# Patient Record
Sex: Female | Born: 1960 | Race: White | Hispanic: No | Marital: Married | Smoking: Never smoker
Health system: Southern US, Community
[De-identification: ages and names within clinical notes are randomized; demographics above are authoritative.]

## PROBLEM LIST (undated history)

## (undated) DIAGNOSIS — M1990 Unspecified osteoarthritis, unspecified site: Secondary | ICD-10-CM

## (undated) DIAGNOSIS — F1910 Other psychoactive substance abuse, uncomplicated: Secondary | ICD-10-CM

## (undated) DIAGNOSIS — F329 Major depressive disorder, single episode, unspecified: Secondary | ICD-10-CM

## (undated) DIAGNOSIS — F32A Depression, unspecified: Secondary | ICD-10-CM

## (undated) HISTORY — DX: Unspecified osteoarthritis, unspecified site: M19.90

## (undated) HISTORY — PX: KNEE ARTHROSCOPY WITH ANTERIOR CRUCIATE LIGAMENT (ACL) REPAIR: SHX5644

## (undated) HISTORY — DX: Depression, unspecified: F32.A

## (undated) HISTORY — DX: Major depressive disorder, single episode, unspecified: F32.9

## (undated) HISTORY — DX: Other psychoactive substance abuse, uncomplicated: F19.10

---

## 2017-01-11 DIAGNOSIS — Z79899 Other long term (current) drug therapy: Secondary | ICD-10-CM | POA: Diagnosis not present

## 2017-01-11 DIAGNOSIS — M069 Rheumatoid arthritis, unspecified: Secondary | ICD-10-CM | POA: Diagnosis not present

## 2017-01-16 DIAGNOSIS — M797 Fibromyalgia: Secondary | ICD-10-CM | POA: Diagnosis not present

## 2017-01-16 DIAGNOSIS — M2550 Pain in unspecified joint: Secondary | ICD-10-CM | POA: Diagnosis not present

## 2017-01-16 DIAGNOSIS — M069 Rheumatoid arthritis, unspecified: Secondary | ICD-10-CM | POA: Diagnosis not present

## 2017-01-16 DIAGNOSIS — Z79899 Other long term (current) drug therapy: Secondary | ICD-10-CM | POA: Diagnosis not present

## 2017-03-13 DIAGNOSIS — M797 Fibromyalgia: Secondary | ICD-10-CM | POA: Diagnosis not present

## 2017-03-13 DIAGNOSIS — M069 Rheumatoid arthritis, unspecified: Secondary | ICD-10-CM | POA: Diagnosis not present

## 2017-03-13 DIAGNOSIS — M2550 Pain in unspecified joint: Secondary | ICD-10-CM | POA: Diagnosis not present

## 2017-03-13 DIAGNOSIS — E663 Overweight: Secondary | ICD-10-CM | POA: Diagnosis not present

## 2017-04-23 DIAGNOSIS — F329 Major depressive disorder, single episode, unspecified: Secondary | ICD-10-CM | POA: Diagnosis not present

## 2017-04-23 DIAGNOSIS — L237 Allergic contact dermatitis due to plants, except food: Secondary | ICD-10-CM | POA: Diagnosis not present

## 2017-06-20 DIAGNOSIS — M797 Fibromyalgia: Secondary | ICD-10-CM | POA: Diagnosis not present

## 2017-06-20 DIAGNOSIS — M2550 Pain in unspecified joint: Secondary | ICD-10-CM | POA: Diagnosis not present

## 2017-06-20 DIAGNOSIS — M069 Rheumatoid arthritis, unspecified: Secondary | ICD-10-CM | POA: Diagnosis not present

## 2017-06-20 DIAGNOSIS — Z79899 Other long term (current) drug therapy: Secondary | ICD-10-CM | POA: Diagnosis not present

## 2017-09-05 ENCOUNTER — Encounter: Payer: Self-pay | Admitting: Physician Assistant

## 2017-09-05 ENCOUNTER — Ambulatory Visit (INDEPENDENT_AMBULATORY_CARE_PROVIDER_SITE_OTHER): Payer: BLUE CROSS/BLUE SHIELD | Admitting: Physician Assistant

## 2017-09-05 VITALS — BP 97/67 | HR 74 | Temp 98.6°F | Resp 18 | Ht 68.58 in | Wt 196.9 lb

## 2017-09-05 DIAGNOSIS — R05 Cough: Secondary | ICD-10-CM | POA: Diagnosis not present

## 2017-09-05 DIAGNOSIS — M069 Rheumatoid arthritis, unspecified: Secondary | ICD-10-CM | POA: Insufficient documentation

## 2017-09-05 DIAGNOSIS — R609 Edema, unspecified: Secondary | ICD-10-CM | POA: Diagnosis not present

## 2017-09-05 DIAGNOSIS — J069 Acute upper respiratory infection, unspecified: Secondary | ICD-10-CM | POA: Diagnosis not present

## 2017-09-05 DIAGNOSIS — M797 Fibromyalgia: Secondary | ICD-10-CM | POA: Insufficient documentation

## 2017-09-05 DIAGNOSIS — R52 Pain, unspecified: Secondary | ICD-10-CM | POA: Diagnosis not present

## 2017-09-05 DIAGNOSIS — J3489 Other specified disorders of nose and nasal sinuses: Secondary | ICD-10-CM | POA: Diagnosis not present

## 2017-09-05 DIAGNOSIS — R059 Cough, unspecified: Secondary | ICD-10-CM

## 2017-09-05 LAB — POCT CBC
GRANULOCYTE PERCENT: 56.1 % (ref 37–80)
HCT, POC: 41.1 % (ref 37.7–47.9)
Hemoglobin: 14.1 g/dL (ref 12.2–16.2)
Lymph, poc: 2.6 (ref 0.6–3.4)
MCH: 31.8 pg — AB (ref 27–31.2)
MCHC: 34.3 g/dL (ref 31.8–35.4)
MCV: 92.8 fL (ref 80–97)
MID (CBC): 0.3 (ref 0–0.9)
MPV: 8.2 fL (ref 0–99.8)
PLATELET COUNT, POC: 275 10*3/uL (ref 142–424)
POC Granulocyte: 3.7 (ref 2–6.9)
POC LYMPH %: 39 % (ref 10–50)
POC MID %: 4.9 % (ref 0–12)
RBC: 4.43 M/uL (ref 4.04–5.48)
RDW, POC: 13.8 %
WBC: 6.6 10*3/uL (ref 4.6–10.2)

## 2017-09-05 LAB — POCT INFLUENZA A/B
INFLUENZA A, POC: NEGATIVE
INFLUENZA B, POC: NEGATIVE

## 2017-09-05 MED ORDER — FLUTICASONE PROPIONATE 50 MCG/ACT NA SUSP: 2.0000 | Freq: Every day | NASAL | 0 refills | Status: DC

## 2017-09-05 MED ORDER — BENZONATATE 100 MG PO CAPS: 100.0000 mg | ORAL_CAPSULE | Freq: Three times a day (TID) | ORAL | 0 refills | Status: DC | PRN

## 2017-09-05 MED ORDER — ALBUTEROL SULFATE (2.5 MG/3ML) 0.083% IN NEBU
2.5000 mg | INHALATION_SOLUTION | Freq: Once | RESPIRATORY_TRACT | Status: AC
  Administered 2017-09-05: 2.5 mg via RESPIRATORY_TRACT

## 2017-09-05 MED ORDER — PSEUDOEPHEDRINE HCL ER 120 MG PO TB12: 120.0000 mg | ORAL_TABLET | Freq: Two times a day (BID) | ORAL | 0 refills | Status: DC

## 2017-09-05 MED ORDER — IPRATROPIUM BROMIDE 0.02 % IN SOLN
0.5000 mg | Freq: Once | RESPIRATORY_TRACT | Status: AC
  Administered 2017-09-05: 0.5 mg via RESPIRATORY_TRACT

## 2017-09-05 NOTE — Progress Notes (Signed)
MRN: [id number] DOB: [birth year]  Subjective:   Tracey Branch is a 56 y.o. female presenting for chief complaint of Fatigue (X 4 days- Pt states she is having flu like symptoms ); Cough; and Generalized Body Aches .  Reports 4 days hx sudden onset chills, headache, sinus pain, rhinorrhea, ear fullness, sore throat, myalgia, post nasal drip, and non productive cough  chills and fatigue. Has noticed some wheezing. Has tried dayquil and theraflu with moderate relief. Denies fever, sinus headache, shortness of breath and chest pain, nausea, vomiting and abdominal pain. 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 No history of seasonal allergies, no history of asthma. Patient has not had flu shot this season. Denies smoking and alcohol use. Denies any other aggravating or relieving factors, no other questions or concerns. Typically has lower blood pressure. Has been able to drink fluids and eat food.   Desarie has a current medication list which includes the following prescription(s): duloxetine, fluoxetine, methotrexate, benzonatate, rheumate, fluticasone, pseudoephedrine, and xeljanz xr. Also is allergic to iodine.  Carloyn  has a past medical history of Arthritis; Depression; and Substance abuse. Also  has no past surgical history on file.   Objective:   Vitals: BP 97/67 (BP Location: Right Arm, Patient Position: Sitting, Cuff Size: Large)   Pulse 74   Temp 98.6 F (37 C) (Oral)   Resp 18   Ht 5' 8.58" (1.742 m)   Wt 196 lb 14.4 oz (89.3 kg)   SpO2 97%   BMI 29.43 kg/m   Physical Exam  Constitutional: She is oriented to person, place, and time. She appears well-developed and well-nourished. She appears distressed (appears uncomfortable sitting on exam table).  HENT:  Head: Normocephalic and atraumatic.  Right Ear: Tympanic membrane, external ear and ear canal normal.  Left Ear: Tympanic membrane, external ear and ear canal normal.  Nose: Mucosal  edema (mild noted bilaterally) present. No rhinorrhea. Right sinus exhibits maxillary sinus tenderness (mild with palpation and leanign head forward). Right sinus exhibits no frontal sinus tenderness. Left sinus exhibits maxillary sinus tenderness (mild with palpation and leanign head forward). Left sinus exhibits no frontal sinus tenderness.  Mouth/Throat: Uvula is midline, oropharynx is clear and moist and mucous membranes are normal.  Eyes: Conjunctivae are normal.  Neck: Normal range of motion.  Cardiovascular: Normal rate, regular rhythm and normal heart sounds.   Pulmonary/Chest: Effort normal. She has no decreased breath sounds. She has wheezes (no wheezes with normal breaths, minimal wheezing ausculated at end of forced expiratory breaths). She has no rhonchi. She has no rales.  Lymphadenopathy:       Head (right side): No submental, no submandibular, no tonsillar, no preauricular, no posterior auricular and no occipital adenopathy present.       Head (left side): No submental, no submandibular, no tonsillar, no preauricular, no posterior auricular and no occipital adenopathy present.    She has no cervical adenopathy.       Right: No supraclavicular adenopathy present.       Left: No supraclavicular adenopathy present.  Neurological: She is alert and oriented to person, place, and time.  Skin: Skin is warm and dry.  Psychiatric: She has a normal mood and affect.  Vitals reviewed.   Results for orders placed or performed in visit on 09/05/17 (from the past 24 hour(s))  POCT Influenza A/B     Status: None   Collection Time: 09/05/17 11:24 AM  Result Value Ref Range  Influenza A, POC Negative Negative   Influenza B, POC Negative Negative  POCT CBC     Status: Abnormal   Collection Time: 09/05/17 11:50 AM  Result Value Ref Range   WBC 6.6 4.6 - 10.2 K/uL   Lymph, poc 2.6 0.6 - 3.4   POC LYMPH PERCENT 39.0 10 - 50 %L   MID (cbc) 0.3 0 - 0.9   POC MID % 4.9 0 - 12 %M   POC  Granulocyte 3.7 2 - 6.9   Granulocyte percent 56.1 37 - 80 %G   RBC 4.43 4.04 - 5.48 M/uL   Hemoglobin 14.1 12.2 - 16.2 g/dL   HCT, POC 27.0 35.0 - 47.9 %   MCV 92.8 80 - 97 fL   MCH, POC 31.8 (A) 27 - 31.2 pg   MCHC 34.3 31.8 - 35.4 g/dL   RDW, POC 09.3 %   Platelet Count, POC 275 142 - 424 K/uL   MPV 8.2 0 - 99.8 fL    Assessment and Plan :  1. Body aches - POCT Influenza A/B  2. Cough - albuterol (PROVENTIL) (2.5 MG/3ML) 0.083% nebulizer solution 2.5 mg; Take 3 mLs (2.5 mg total) by nebulization once. - ipratropium (ATROVENT) nebulizer solution 0.5 mg; Take 2.5 mLs (0.5 mg total) by nebulization once. - POCT CBC - benzonatate (TESSALON) 100 MG capsule; Take 1-2 capsules (100-200 mg total) by mouth 3 (three) times daily as needed for cough.  Dispense: 40 capsule; Refill: 0  3. Sinus pressure - pseudoephedrine (SUDAFED 12 HOUR) 120 MG 12 hr tablet; Take 1 tablet (120 mg total) by mouth 2 (two) times daily.  Dispense: 20 tablet; Refill: 0  4. Mucosal edema - fluticasone (FLONASE) 50 MCG/ACT nasal spray; Place 2 sprays into both nostrils daily.  Dispense: 16 g; Refill: 0  5. Acute upper respiratory infection Hx and PE findings consistent with viral URI. POCT reassuring. Vitals stable. Will treat symptomatically at this time. Pt encouraged to return to clinic if symptoms worsen, do not improve in 3-5 days, or as needed  Benjiman Core, PA-C  [facility] at [facility] 09/05/2017 1:58 PM

## 2017-09-05 NOTE — Patient Instructions (Addendum)
- We will treat this as a respiratory viral infection.  - I recommend you rest, drink plenty of fluids, eat light meals including soups.  - You may use Tessalon pearls during the day for your cough. - You may use sudafed and flonase daily for head and nasal congestion  - You may also use Tylenol or ibuprofen over-the-counter for your sore throat.  - Please let me know if you are not seeing any improvement or get worse in 3-5 days. Return sooner if symptoms worsen.    Upper Respiratory Infection, Adult Most upper respiratory infections (URIs) are caused by a virus. A URI affects the nose, throat, and upper air passages. The most common type of URI is often called "the common cold." Follow these instructions at home:  Take medicines only as told by your doctor.  Gargle warm saltwater or take cough drops to comfort your throat as told by your doctor.  Use a warm mist humidifier or inhale steam from a shower to increase air moisture. This may make it easier to breathe.  Drink enough fluid to keep your pee (urine) clear or pale yellow.  Eat soups and other clear broths.  Have a healthy diet.  Rest as needed.  Go back to work when your fever is gone or your doctor says it is okay. ? You may need to stay home longer to avoid giving your URI to others. ? You can also wear a face mask and wash your hands often to prevent spread of the virus.  Use your inhaler more if you have asthma.  Do not use any tobacco products, including cigarettes, chewing tobacco, or electronic cigarettes. If you need help quitting, ask your doctor. Contact a doctor if:  You are getting worse, not better.  Your symptoms are not helped by medicine.  You have chills.  You are getting more short of breath.  You have brown or red mucus.  You have yellow or brown discharge from your nose.  You have pain in your face, especially when you bend forward.  You have a fever.  You have puffy (swollen) neck  glands.  You have pain while swallowing.  You have white areas in the back of your throat. Get help right away if:  You have very bad or constant: ? Headache. ? Ear pain. ? Pain in your forehead, behind your eyes, and over your cheekbones (sinus pain). ? Chest pain.  You have long-lasting (chronic) lung disease and any of the following: ? Wheezing. ? Long-lasting cough. ? Coughing up blood. ? A change in your usual mucus.  You have a stiff neck.  You have changes in your: ? Vision. ? Hearing. ? Thinking. ? Mood. This information is not intended to replace advice given to you by your health care provider. Make sure you discuss any questions you have with your health care provider. Document Released: 05/15/2008 Document Revised: 07/30/2016 Document Reviewed: 03/04/2014 Elsevier Interactive Patient Education  2018 [employer].      IF you received an x-ray today, you will receive an invoice from [facility]. Please contact [facility] at [phone] with questions or concerns regarding your invoice.   IF you received labwork today, you will receive an invoice from [facility]. Please contact [facility] at [phone] with questions or concerns regarding your invoice.   Our billing staff will not be able to assist you with questions regarding bills from these companies.  You will be contacted with the lab results as soon as  they are available. The fastest way to get your results is to activate your My Chart account. Instructions are located on the last page of this paperwork. If you have not heard from [place] regarding the results in 2 weeks, please contact this office.

## 2017-09-05 NOTE — Progress Notes (Deleted)
MRN: [id number] DOB: [birth year]  Subjective:   Tracey Branch is a 56 y.o. female presenting for chief complaint of Fatigue (X 4 days- Pt states she is having flu like symptoms ); Cough; and Generalized Body Aches .  Reports 4 days hx sudden onset chills, headache, sinus pain, rhinorrhea, ear fullness, sore throat, myalgia, post nasal drip, and non productive cough  chills and fatigue. Has noticed some wheezing. Has tried dayquil and theraflu with moderate relief. Denies fever, sinus headache, shortness of breath and chest pain, nausea, vomiting and abdominal pain. 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 No history of seasonal allergies, no history of asthma. Patient has not had flu shot this season. Denies smoking and alcohol use. Denies any other aggravating or relieving factors, no other questions or concerns. Typically has lower blood pressure. Has been able to drink fluids and eat food.    Tracey Branch has a current medication list which includes the following prescription(s): duloxetine, fluoxetine, methotrexate, rheumate, and xeljanz xr, and the following Facility-Administered Medications: albuterol and ipratropium. Also is allergic to iodine.  Tracey Branch  has a past medical history of Arthritis; Depression; and Substance abuse. Also  has no past surgical history on file.   Objective:   Vitals: BP 97/67 (BP Location: Right Arm, Patient Position: Sitting, Cuff Size: Large)   Pulse 74   Temp 98.6 F (37 C) (Oral)   Resp 18   Ht 5' 8.58" (1.742 m)   Wt 196 lb 14.4 oz (89.3 kg)   SpO2 97%   BMI 29.43 kg/m   Physical Exam  Constitutional: She is oriented to person, place, and time. She appears well-developed and well-nourished. She appears distressed (appears uncomfortable sitting on exam table).  HENT:  Head: Normocephalic and atraumatic.  Right Ear: Tympanic membrane, external ear and ear canal normal.  Left Ear: Tympanic membrane, external ear and  ear canal normal.  Nose: Mucosal edema (mild noted bilaterally) present. Right sinus exhibits maxillary sinus tenderness (with palpation and leaning head forward). Right sinus exhibits no frontal sinus tenderness. Left sinus exhibits maxillary sinus tenderness (with palpation and leaning head forward). Left sinus exhibits no frontal sinus tenderness.  Mouth/Throat: Uvula is midline, oropharynx is clear and moist and mucous membranes are normal. No tonsillar exudate.  Eyes: Conjunctivae are normal.  Neck: Normal range of motion.  Cardiovascular: Normal rate, regular rhythm and normal heart sounds.   Pulmonary/Chest: Effort normal. She has no decreased breath sounds. She has wheezes (no wheezes with normal breaths, mild wheezing noted with forced expiratory breaths). She has no rhonchi. She has no rales.  Lymphadenopathy:       Head (right side): No submental, no submandibular, no tonsillar, no preauricular, no posterior auricular and no occipital adenopathy present.       Head (left side): No submental, no submandibular, no tonsillar, no preauricular, no posterior auricular and no occipital adenopathy present.    She has no cervical adenopathy.       Right: No supraclavicular adenopathy present.       Left: No supraclavicular adenopathy present.  Neurological: She is alert and oriented to person, place, and time.  Skin: Skin is warm and dry.  Psychiatric: She has a normal mood and affect.  Vitals reviewed.   Results for orders placed or performed in visit on 09/05/17 (from the past 24 hour(s))  POCT Influenza A/B     Status: None   Collection Time: 09/05/17 11:24 AM  Result  Value Ref Range   Influenza A, POC Negative Negative   Influenza B, POC Negative Negative  POCT CBC     Status: Abnormal   Collection Time: 09/05/17 11:50 AM  Result Value Ref Range   WBC 6.6 4.6 - 10.2 K/uL   Lymph, poc 2.6 0.6 - 3.4   POC LYMPH PERCENT 39.0 10 - 50 %L   MID (cbc) 0.3 0 - 0.9   POC MID % 4.9 0 - 12  %M   POC Granulocyte 3.7 2 - 6.9   Granulocyte percent 56.1 37 - 80 %G   RBC 4.43 4.04 - 5.48 M/uL   Hemoglobin 14.1 12.2 - 16.2 g/dL   HCT, POC 00.9 38.1 - 47.9 %   MCV 92.8 80 - 97 fL   MCH, POC 31.8 (A) 27 - 31.2 pg   MCHC 34.3 31.8 - 35.4 g/dL   RDW, POC 82.9 %   Platelet Count, POC 275 142 - 424 K/uL   MPV 8.2 0 - 99.8 fL    Assessment and Plan :  1. Body aches *** - POCT Influenza A/B   Benjiman Core, PA-C  [facility] 09/05/2017 12:02 PM

## 2017-10-01 ENCOUNTER — Other Ambulatory Visit: Payer: Self-pay | Admitting: Physician Assistant

## 2017-10-01 DIAGNOSIS — R609 Edema, unspecified: Secondary | ICD-10-CM

## 2017-10-08 DIAGNOSIS — Z23 Encounter for immunization: Secondary | ICD-10-CM | POA: Diagnosis not present

## 2017-10-08 DIAGNOSIS — M797 Fibromyalgia: Secondary | ICD-10-CM | POA: Diagnosis not present

## 2017-10-08 DIAGNOSIS — M2550 Pain in unspecified joint: Secondary | ICD-10-CM | POA: Diagnosis not present

## 2017-10-08 DIAGNOSIS — M069 Rheumatoid arthritis, unspecified: Secondary | ICD-10-CM | POA: Diagnosis not present

## 2017-10-22 ENCOUNTER — Other Ambulatory Visit: Payer: BLUE CROSS/BLUE SHIELD | Admitting: Internal Medicine

## 2017-10-22 DIAGNOSIS — Z130 Encounter for screening for diseases of the blood and blood-forming organs and certain disorders involving the immune mechanism: Secondary | ICD-10-CM

## 2017-10-22 DIAGNOSIS — Z13220 Encounter for screening for lipoid disorders: Secondary | ICD-10-CM | POA: Diagnosis not present

## 2017-10-22 DIAGNOSIS — Z1329 Encounter for screening for other suspected endocrine disorder: Secondary | ICD-10-CM | POA: Diagnosis not present

## 2017-10-22 DIAGNOSIS — Z1321 Encounter for screening for nutritional disorder: Secondary | ICD-10-CM

## 2017-10-22 DIAGNOSIS — Z0000 Encounter for general adult medical examination without abnormal findings: Secondary | ICD-10-CM | POA: Diagnosis not present

## 2017-10-23 ENCOUNTER — Other Ambulatory Visit (HOSPITAL_COMMUNITY)
Admission: RE | Admit: 2017-10-23 | Discharge: 2017-10-23 | Disposition: A | Discharge status: Home / Self Care | Payer: BLUE CROSS/BLUE SHIELD | Source: Ambulatory Visit | Attending: Internal Medicine | Admitting: Internal Medicine

## 2017-10-23 ENCOUNTER — Encounter: Payer: Self-pay | Admitting: Internal Medicine

## 2017-10-23 ENCOUNTER — Ambulatory Visit (INDEPENDENT_AMBULATORY_CARE_PROVIDER_SITE_OTHER): Payer: BLUE CROSS/BLUE SHIELD | Admitting: Internal Medicine

## 2017-10-23 VITALS — BP 98/62 | HR 68 | Temp 97.8°F | Ht 68.0 in | Wt 199.0 lb

## 2017-10-23 DIAGNOSIS — K219 Gastro-esophageal reflux disease without esophagitis: Secondary | ICD-10-CM

## 2017-10-23 DIAGNOSIS — M069 Rheumatoid arthritis, unspecified: Secondary | ICD-10-CM | POA: Diagnosis not present

## 2017-10-23 DIAGNOSIS — E7800 Pure hypercholesterolemia, unspecified: Secondary | ICD-10-CM | POA: Diagnosis not present

## 2017-10-23 DIAGNOSIS — Z0000 Encounter for general adult medical examination without abnormal findings: Secondary | ICD-10-CM | POA: Insufficient documentation

## 2017-10-23 LAB — COMPLETE METABOLIC PANEL WITH GFR
AG Ratio: 1.8 (calc) (ref 1.0–2.5)
ALKALINE PHOSPHATASE (APISO): 48 U/L (ref 33–130)
ALT: 24 U/L (ref 6–29)
AST: 25 U/L (ref 10–35)
Albumin: 4.2 g/dL (ref 3.6–5.1)
BILIRUBIN TOTAL: 0.5 mg/dL (ref 0.2–1.2)
BUN: 18 mg/dL (ref 7–25)
CHLORIDE: 106 mmol/L (ref 98–110)
CO2: 30 mmol/L (ref 20–32)
CREATININE: 0.88 mg/dL (ref 0.50–1.05)
Calcium: 9.5 mg/dL (ref 8.6–10.4)
GFR, Est African American: 86 mL/min/{1.73_m2} (ref >=60)
GFR, Est Non African American: 74 mL/min/{1.73_m2} (ref >=60)
GLUCOSE: 87 mg/dL (ref 65–99)
Globulin: 2.4 g/dL (calc) (ref 1.9–3.7)
Potassium: 4.9 mmol/L (ref 3.5–5.3)
Sodium: 141 mmol/L (ref 135–146)
Total Protein: 6.6 g/dL (ref 6.1–8.1)

## 2017-10-23 LAB — CBC WITH DIFFERENTIAL/PLATELET
BASOS ABS: 10 {cells}/uL (ref 0–200)
BASOS PCT: 0.2 %
EOS ABS: 78 {cells}/uL (ref 15–500)
EOS PCT: 1.5 %
HCT: 39.4 % (ref 35.0–45.0)
HEMOGLOBIN: 13.6 g/dL (ref 11.7–15.5)
Lymphs Abs: 1654 cells/uL (ref 850–3900)
MCH: 31.5 pg (ref 27.0–33.0)
MCHC: 34.5 g/dL (ref 32.0–36.0)
MCV: 91.2 fL (ref 80.0–100.0)
MONOS PCT: 9.8 %
MPV: 11.1 fL (ref 7.5–12.5)
Neutro Abs: 2948 cells/uL (ref 1500–7800)
Neutrophils Relative %: 56.7 %
PLATELETS: 257 10*3/uL (ref 140–400)
RBC: 4.32 10*6/uL (ref 3.80–5.10)
RDW: 12.5 % (ref 11.0–15.0)
TOTAL LYMPHOCYTE: 31.8 %
WBC mixed population: 510 cells/uL (ref 200–950)
WBC: 5.2 10*3/uL (ref 3.8–10.8)

## 2017-10-23 LAB — POCT URINALYSIS DIPSTICK
BILIRUBIN UA: NEGATIVE
Blood, UA: NEGATIVE
GLUCOSE UA: NEGATIVE
KETONES UA: NEGATIVE
LEUKOCYTES UA: NEGATIVE
Nitrite, UA: NEGATIVE
PH UA: 6.5 (ref 5.0–8.0)
Protein, UA: NEGATIVE
Spec Grav, UA: 1.025 (ref 1.010–1.025)
Urobilinogen, UA: 0.2 E.U./dL

## 2017-10-23 LAB — LIPID PANEL
CHOL/HDL RATIO: 4.6 (calc) (ref <5.0)
CHOLESTEROL: 291 mg/dL — AB (ref <200)
HDL: 63 mg/dL (ref >50)
LDL CHOLESTEROL (CALC): 210 mg/dL — AB
Non-HDL Cholesterol (Calc): 228 mg/dL (calc) — ABNORMAL HIGH (ref <130)
TRIGLYCERIDES: 70 mg/dL (ref <150)

## 2017-10-23 LAB — TSH: TSH: 4.04 m[IU]/L

## 2017-10-23 LAB — VITAMIN D 25 HYDROXY (VIT D DEFICIENCY, FRACTURES): VIT D 25 HYDROXY: 28 ng/mL — AB (ref 30–100)

## 2017-10-23 MED ORDER — OMEPRAZOLE 20 MG PO CPDR: 20.0000 mg | DELAYED_RELEASE_CAPSULE | Freq: Every day | ORAL | 3 refills | Status: DC

## 2017-10-23 MED ORDER — FLUOXETINE HCL 40 MG PO CAPS: 40.0000 mg | ORAL_CAPSULE | Freq: Every day | ORAL | 3 refills | Status: DC

## 2017-10-23 MED ORDER — DULOXETINE HCL 20 MG PO CPEP: 20.0000 mg | ORAL_CAPSULE | Freq: Every day | ORAL | 2 refills | Status: DC

## 2017-10-23 MED ORDER — DULOXETINE HCL 20 MG PO CPEP: 20.0000 mg | ORAL_CAPSULE | Freq: Every day | ORAL | 0 refills | Status: DC

## 2017-10-25 ENCOUNTER — Telehealth: Payer: Self-pay

## 2017-10-25 LAB — CYTOLOGY - PAP: DIAGNOSIS: NEGATIVE

## 2017-10-25 NOTE — Telephone Encounter (Signed)
-----   Message from Margaree Mackintosh, MD sent at 10/25/2017 11:09 AM EST ----- Please call pt. Pap is normal.

## 2017-10-25 NOTE — Telephone Encounter (Signed)
Pt is aware.  

## 2017-11-01 ENCOUNTER — Other Ambulatory Visit: Payer: Self-pay | Admitting: Physician Assistant

## 2017-11-01 DIAGNOSIS — R609 Edema, unspecified: Secondary | ICD-10-CM

## 2017-11-09 DIAGNOSIS — E7800 Pure hypercholesterolemia, unspecified: Secondary | ICD-10-CM | POA: Insufficient documentation

## 2017-11-09 NOTE — Progress Notes (Signed)
   Subjective:    Patient ID: Tracey Branch, female    DOB: 1961-11-21, 56 y.o.   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  History of rheumatoid arthritis followed by Elpidio Anis  [facility].  Diagnosed in 2015 when living in [place] complaining of fatigue and bilateral hand and foot pain.  Triggered following mother's death.  Rheumatoid arthritis is seronegative.  History of fibromyalgia syndrome as well.  History of mild depression treated with SSRI.  Rheumatoid arthritis has been treated with Harriette Ohara, methotrexate, Cymbalta and Celebrex  Family history: Father deceased at 21 years of age with  aneurysm.  Mother died at 43 years of age with MI and with history of hypertension.  Paternal grandmother with history of rheumatoid arthritis.  2 brothers in good health.  2 adopted daughter  from [place] both age 90 that are healthy.  2 sons ages 64 and 57 healthy.  Social history: Husband is the [employer] at [employer] in [place].  Moved here from [place] and formerly lived in [place].  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  History of right ACL repair 2006.  History of right meniscal repair 2006.  Became menopausal 2012  Had flu vaccine 10/15/2017   Review of Systems long-standing history of depression most of her adult life.  Patient has been sober since 1998.  Does not smoke.     Objective:   Physical Exam  Constitutional: She is oriented to person, place, and time. She appears well-developed and well-nourished. No distress.  HENT:  Head: Normocephalic and atraumatic.  Right Ear: External ear normal.  Left Ear: External ear normal.  Mouth/Throat: Oropharynx is clear and moist.  Eyes: Conjunctivae and EOM are normal. Pupils are equal, round, and reactive to light. Right eye exhibits no discharge. Left eye exhibits no discharge. No scleral icterus.  Neck: No JVD present.  No thyromegaly present.  Cardiovascular: Normal rate, regular rhythm, normal heart sounds and intact distal pulses.  No murmur heard. Pulmonary/Chest: Effort normal and breath sounds normal. No respiratory distress. She has no wheezes. She has no rales.  Abdominal: Soft. Bowel sounds are normal. She exhibits no distension and no mass. There is no tenderness. There is no rebound and no guarding.  Genitourinary:  Genitourinary Comments: Pap taken.  Bimanual normal  Musculoskeletal: She exhibits no edema.  Lymphadenopathy:    She has no cervical adenopathy.  Neurological: She is alert and oriented to person, place, and time. She has normal reflexes. No cranial nerve deficit. Coordination normal.  Skin: Skin is warm and dry. No rash noted. She is not diaphoretic.  Psychiatric: She has a normal mood and affect. Her behavior is normal. Judgment and thought content normal.  Vitals reviewed.         Assessment & Plan:  Rheumatoid arthritis followed by rheumatologist  History of depression  History of fibromyalgia syndrome  Rash on extremities?  Related to rheumatoid arthritis versus eczema  Hyperlipidemia-total cholesterol 291 and LDL cholesterol 210  Vitamin D deficiency-recommend vitamin D3 daily 2000 units  High normal TSH-repeat in February  Plan: Dermatology consultation.  Talked about starting statin medication but will follow up in 3 months with lipid panel.  Watch diet.  Try to get some exercise.

## 2017-11-09 NOTE — Patient Instructions (Signed)
Follow-up in February with TSH and repeat lipid panel.  See dermatologist regarding rash

## 2017-12-28 ENCOUNTER — Other Ambulatory Visit: Payer: Self-pay | Admitting: Internal Medicine

## 2017-12-28 DIAGNOSIS — R7989 Other specified abnormal findings of blood chemistry: Secondary | ICD-10-CM

## 2017-12-28 DIAGNOSIS — E785 Hyperlipidemia, unspecified: Secondary | ICD-10-CM

## 2018-01-07 DIAGNOSIS — D1801 Hemangioma of skin and subcutaneous tissue: Secondary | ICD-10-CM | POA: Diagnosis not present

## 2018-01-07 DIAGNOSIS — L814 Other melanin hyperpigmentation: Secondary | ICD-10-CM | POA: Diagnosis not present

## 2018-01-07 DIAGNOSIS — L821 Other seborrheic keratosis: Secondary | ICD-10-CM | POA: Diagnosis not present

## 2018-01-07 DIAGNOSIS — D225 Melanocytic nevi of trunk: Secondary | ICD-10-CM | POA: Diagnosis not present

## 2018-01-08 DIAGNOSIS — M069 Rheumatoid arthritis, unspecified: Secondary | ICD-10-CM | POA: Diagnosis not present

## 2018-01-08 DIAGNOSIS — M2550 Pain in unspecified joint: Secondary | ICD-10-CM | POA: Diagnosis not present

## 2018-01-08 DIAGNOSIS — Z79899 Other long term (current) drug therapy: Secondary | ICD-10-CM | POA: Diagnosis not present

## 2018-01-08 DIAGNOSIS — M797 Fibromyalgia: Secondary | ICD-10-CM | POA: Diagnosis not present

## 2018-01-29 ENCOUNTER — Other Ambulatory Visit: Payer: BLUE CROSS/BLUE SHIELD | Admitting: Internal Medicine

## 2018-01-29 DIAGNOSIS — E785 Hyperlipidemia, unspecified: Secondary | ICD-10-CM | POA: Diagnosis not present

## 2018-01-29 DIAGNOSIS — R7989 Other specified abnormal findings of blood chemistry: Secondary | ICD-10-CM | POA: Diagnosis not present

## 2018-01-29 LAB — LIPID PANEL
Cholesterol: 272 mg/dL — ABNORMAL HIGH (ref <200)
HDL: 58 mg/dL (ref >50)
LDL Cholesterol (Calc): 188 mg/dL (calc) — ABNORMAL HIGH
NON-HDL CHOLESTEROL (CALC): 214 mg/dL — AB (ref <130)
TRIGLYCERIDES: 127 mg/dL (ref <150)
Total CHOL/HDL Ratio: 4.7 (calc) (ref <5.0)

## 2018-01-29 LAB — TSH: TSH: 6.86 mIU/L — ABNORMAL HIGH

## 2018-02-05 ENCOUNTER — Ambulatory Visit: Payer: BLUE CROSS/BLUE SHIELD | Admitting: Internal Medicine

## 2018-02-06 ENCOUNTER — Telehealth: Payer: Self-pay

## 2018-02-06 NOTE — Telephone Encounter (Signed)
Left detailed message, pt to call to rescheduled missed apt from yesterday and also we need to know if she's taking her thyroid medication.

## 2018-02-08 ENCOUNTER — Encounter: Payer: Self-pay | Admitting: Internal Medicine

## 2018-02-08 ENCOUNTER — Ambulatory Visit (INDEPENDENT_AMBULATORY_CARE_PROVIDER_SITE_OTHER): Payer: BLUE CROSS/BLUE SHIELD | Admitting: Internal Medicine

## 2018-02-08 VITALS — BP 102/70 | HR 82 | Ht 68.0 in | Wt 201.0 lb

## 2018-02-08 DIAGNOSIS — E785 Hyperlipidemia, unspecified: Secondary | ICD-10-CM

## 2018-02-08 DIAGNOSIS — E039 Hypothyroidism, unspecified: Secondary | ICD-10-CM | POA: Diagnosis not present

## 2018-02-08 MED ORDER — LEVOTHYROXINE SODIUM 50 MCG PO TABS
50.0000 ug | ORAL_TABLET | Freq: Every day | ORAL | 0 refills | Status: DC
Start: 2018-02-08 — End: 2018-02-25

## 2018-02-25 ENCOUNTER — Telehealth: Payer: Self-pay | Admitting: Internal Medicine

## 2018-02-25 MED ORDER — FLUOXETINE HCL 40 MG PO CAPS: 40.0000 mg | ORAL_CAPSULE | Freq: Every day | ORAL | 3 refills | Status: DC

## 2018-02-25 MED ORDER — LEVOTHYROXINE SODIUM 50 MCG PO TABS: 50.0000 ug | ORAL_TABLET | Freq: Every day | ORAL | 3 refills | Status: DC

## 2018-02-25 NOTE — Telephone Encounter (Signed)
Please refill these for one year to [facility]

## 2018-02-25 NOTE — Telephone Encounter (Signed)
Requesting a refill on Levothyroxine and Prozac 40mg  be sent to her [facility] Mail order pharmacy.  She cannot continue to be filled at local pharmacy or her insurance will not pay for Rx's.    Pharmacy:  [facility] [phone] in [place], [place])  Phone:  [phone]  Thank you.

## 2018-03-02 DIAGNOSIS — E039 Hypothyroidism, unspecified: Secondary | ICD-10-CM | POA: Insufficient documentation

## 2018-03-02 NOTE — Progress Notes (Signed)
   Subjective:    Patient ID: Tracey Branch, female    DOB: 07-Apr-1961, 57 y.o.   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  TSH was 4.04 in November and is now 6.86.  This is consistent with hypothyroidism.  Suggest she start thyroid replacement medication and follow-up in 6 weeks.  In November she was found to have significant hyperlipidemia with total cholesterol of 291 and an LDL cholesterol of 210.  HDL cholesterol is good at 63 and triglycerides normal at 70.  Nail total cholesterol was 272 with an LDL cholesterol of 188 with normal HDL and triglycerides.  She really does not want to be on lipid medication.  I think we should treat the hypothyroidism first.  We will start her on 0.05 mg daily levothyroxine and follow-up in 6 weeks with lipid panel and TSH.  She still may need lipid-lowering medication.  She has a history of rheumatoid arthritis and is on Celebrex and Xeljanz.  Also takes Cymbalta and Prozac.    Review of Systems     Objective:   Physical Exam She has no thyromegaly       Assessment & Plan:  Hypothyroidism-new onset  Hyperlipidemia-slightly improved.   Plan: Follow-up in 6 weeks with lipid panel and TSH.  She still may need statin medication to control lipids.   Plan:'s levothyroxine 0.05 mg daily

## 2018-03-02 NOTE — Patient Instructions (Signed)
Begin levothyroxine 0.05 mg daily and follow-up in 6 weeks.  Watch diet and try to get some exercise for elevated lipids.

## 2018-03-13 ENCOUNTER — Other Ambulatory Visit: Payer: Self-pay

## 2018-03-13 DIAGNOSIS — E039 Hypothyroidism, unspecified: Secondary | ICD-10-CM

## 2018-03-13 DIAGNOSIS — E7800 Pure hypercholesterolemia, unspecified: Secondary | ICD-10-CM

## 2018-04-02 ENCOUNTER — Other Ambulatory Visit: Payer: BLUE CROSS/BLUE SHIELD | Admitting: Internal Medicine

## 2018-04-04 ENCOUNTER — Other Ambulatory Visit: Payer: BLUE CROSS/BLUE SHIELD | Admitting: Internal Medicine

## 2018-04-04 DIAGNOSIS — E039 Hypothyroidism, unspecified: Secondary | ICD-10-CM | POA: Diagnosis not present

## 2018-04-04 DIAGNOSIS — E7800 Pure hypercholesterolemia, unspecified: Secondary | ICD-10-CM

## 2018-04-04 LAB — TSH: TSH: 3.09 mIU/L

## 2018-04-04 LAB — LIPID PANEL
CHOLESTEROL: 264 mg/dL — AB (ref <200)
HDL: 55 mg/dL (ref >50)
LDL CHOLESTEROL (CALC): 179 mg/dL — AB
Non-HDL Cholesterol (Calc): 209 mg/dL (calc) — ABNORMAL HIGH (ref <130)
TRIGLYCERIDES: 153 mg/dL — AB (ref <150)
Total CHOL/HDL Ratio: 4.8 (calc) (ref <5.0)

## 2018-04-05 ENCOUNTER — Ambulatory Visit: Payer: BLUE CROSS/BLUE SHIELD | Admitting: Internal Medicine

## 2018-04-05 ENCOUNTER — Encounter: Payer: Self-pay | Admitting: Internal Medicine

## 2018-04-05 VITALS — BP 120/70 | HR 64 | Ht 68.0 in

## 2018-04-05 DIAGNOSIS — M5481 Occipital neuralgia: Secondary | ICD-10-CM | POA: Diagnosis not present

## 2018-04-05 DIAGNOSIS — E782 Mixed hyperlipidemia: Secondary | ICD-10-CM

## 2018-04-05 DIAGNOSIS — E039 Hypothyroidism, unspecified: Secondary | ICD-10-CM

## 2018-04-05 DIAGNOSIS — M0600 Rheumatoid arthritis without rheumatoid factor, unspecified site: Secondary | ICD-10-CM

## 2018-04-05 MED ORDER — ROSUVASTATIN CALCIUM 5 MG PO TABS: ORAL_TABLET | ORAL | 3 refills | Status: DC

## 2018-04-05 MED ORDER — LEVOTHYROXINE SODIUM 75 MCG PO TABS: 75.0000 ug | ORAL_TABLET | Freq: Every day | ORAL | 1 refills | Status: DC

## 2018-04-06 NOTE — Patient Instructions (Signed)
She will see neurologist regarding occipital neuralgia symptoms.  She will increase levothyroxine to the 0.075 mg daily and follow-up in June.  She will take Crestor 5 mg 3 times weekly and follow-up in June.

## 2018-04-06 NOTE — Progress Notes (Signed)
   Subjective:    Patient ID: Tracey Branch, female    DOB: [birth year], 57 y.o.   MRN: [id number]  HPI   Rev. Louks is here to follow up on hypothyroidism and hyperlipidemia.  TSH has improved from 6 0.86-3.09 on levothyroxine 0.05 mg daily.  We are going to increase to 0.075 mg daily and follow-up in a couple of months.  With regard to hyperlipidemia total cholesterol has not improved significantly.  It previously was 272 and is now 264.  Triglycerides of actually gone up from 1 27 to1 53 and LDL cholesterol has not changed a lot from 1 88 to1 79.  She is willing to try statin medication Crestor 5 mg 3 times a week.  She will return in June for fasting lab work and office visit.  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  She has seronegative rheumatoid arthritis and history of fibromyalgia syndrome.  Arthritis was diagnosed in 2015 when living in [place].  She is followed by [facility] rheumatology.  A few times a year she gets about 3 severe sharp pains in a row in her right occipital area.  It will just last for maybe 5 seconds but each sharp pain increases in intensity and then just disappears completely.  She is concerned because her uncle apparently had a cerebral aneurysm.  Father had history of aortic aneurysm.   Review of Systems     Objective:   Physical Exam No thyromegaly  She has point tenderness in her right occipital area and upper neck.  No focal deficits on brief neurological exam       Assessment & Plan:  ?  Occipital neuralgia-refer to neurologist.  Consider C-spine films with history of rheumatoid arthritis  Rheumatoid arthritis  Hypothyroidism-would like to have TSH around 1.00.  She will increase levothyroxine to 0.075 mg daily and follow-up in June.  Hyperlipidemia-she will start Crestor 5 mg 3 times a week and follow-up in June  Plan: She is concerned because there is an apparent history of cerebral aneurysm and uncle.  She will be  referred to neurologist regarding these attacks of pain.  I think what she has today is musculoskeletal pain and have suggested she try over-the-counter anti-inflammatory medication and ice to the right occipital area.  May need C-spine films with history of rheumatoid arthritis.  She is going to see neurologist for an opinion regarding screening for aneurysm.

## 2018-04-10 DIAGNOSIS — M069 Rheumatoid arthritis, unspecified: Secondary | ICD-10-CM | POA: Diagnosis not present

## 2018-04-10 DIAGNOSIS — M2550 Pain in unspecified joint: Secondary | ICD-10-CM | POA: Diagnosis not present

## 2018-04-10 DIAGNOSIS — M797 Fibromyalgia: Secondary | ICD-10-CM | POA: Diagnosis not present

## 2018-04-10 DIAGNOSIS — Z79899 Other long term (current) drug therapy: Secondary | ICD-10-CM | POA: Diagnosis not present

## 2018-05-10 ENCOUNTER — Other Ambulatory Visit: Payer: Self-pay

## 2018-05-10 DIAGNOSIS — Z79899 Other long term (current) drug therapy: Secondary | ICD-10-CM

## 2018-05-10 DIAGNOSIS — Z5181 Encounter for therapeutic drug level monitoring: Secondary | ICD-10-CM

## 2018-05-10 DIAGNOSIS — E039 Hypothyroidism, unspecified: Secondary | ICD-10-CM

## 2018-05-10 DIAGNOSIS — E7800 Pure hypercholesterolemia, unspecified: Secondary | ICD-10-CM

## 2018-05-28 ENCOUNTER — Other Ambulatory Visit: Payer: BLUE CROSS/BLUE SHIELD | Admitting: Internal Medicine

## 2018-05-29 ENCOUNTER — Ambulatory Visit: Payer: BLUE CROSS/BLUE SHIELD | Admitting: Internal Medicine

## 2018-05-31 ENCOUNTER — Ambulatory Visit: Payer: BLUE CROSS/BLUE SHIELD | Admitting: Internal Medicine

## 2018-06-06 ENCOUNTER — Other Ambulatory Visit: Payer: BLUE CROSS/BLUE SHIELD | Admitting: Internal Medicine

## 2018-06-06 DIAGNOSIS — E039 Hypothyroidism, unspecified: Secondary | ICD-10-CM

## 2018-06-06 DIAGNOSIS — Z79899 Other long term (current) drug therapy: Secondary | ICD-10-CM | POA: Diagnosis not present

## 2018-06-06 DIAGNOSIS — Z5181 Encounter for therapeutic drug level monitoring: Secondary | ICD-10-CM

## 2018-06-06 DIAGNOSIS — E7800 Pure hypercholesterolemia, unspecified: Secondary | ICD-10-CM

## 2018-06-06 LAB — LIPID PANEL
CHOL/HDL RATIO: 3.9 (calc) (ref <5.0)
Cholesterol: 232 mg/dL — ABNORMAL HIGH (ref <200)
HDL: 59 mg/dL (ref >50)
LDL Cholesterol (Calc): 152 mg/dL (calc) — ABNORMAL HIGH
NON-HDL CHOLESTEROL (CALC): 173 mg/dL — AB (ref <130)
TRIGLYCERIDES: 100 mg/dL (ref <150)

## 2018-06-06 LAB — HEPATIC FUNCTION PANEL
AG RATIO: 1.8 (calc) (ref 1.0–2.5)
ALT: 26 U/L (ref 6–29)
AST: 24 U/L (ref 10–35)
Albumin: 4.3 g/dL (ref 3.6–5.1)
Alkaline phosphatase (APISO): 57 U/L (ref 33–130)
BILIRUBIN INDIRECT: 0.3 mg/dL (ref 0.2–1.2)
BILIRUBIN TOTAL: 0.4 mg/dL (ref 0.2–1.2)
Bilirubin, Direct: 0.1 mg/dL (ref 0.0–0.2)
GLOBULIN: 2.4 g/dL (ref 1.9–3.7)
TOTAL PROTEIN: 6.7 g/dL (ref 6.1–8.1)

## 2018-06-06 LAB — TSH: TSH: 1.78 mIU/L

## 2018-06-07 ENCOUNTER — Encounter: Payer: Self-pay | Admitting: Internal Medicine

## 2018-06-07 ENCOUNTER — Ambulatory Visit: Payer: BLUE CROSS/BLUE SHIELD | Admitting: Internal Medicine

## 2018-06-07 VITALS — BP 110/80 | HR 76 | Ht 68.0 in | Wt 201.0 lb

## 2018-06-07 DIAGNOSIS — E7800 Pure hypercholesterolemia, unspecified: Secondary | ICD-10-CM

## 2018-06-07 DIAGNOSIS — E039 Hypothyroidism, unspecified: Secondary | ICD-10-CM

## 2018-06-07 DIAGNOSIS — K219 Gastro-esophageal reflux disease without esophagitis: Secondary | ICD-10-CM | POA: Diagnosis not present

## 2018-06-07 DIAGNOSIS — L718 Other rosacea: Secondary | ICD-10-CM

## 2018-06-07 MED ORDER — METRONIDAZOLE 1 % EX GEL: CUTANEOUS | 0 refills | Status: DC

## 2018-06-07 MED ORDER — ROSUVASTATIN CALCIUM 5 MG PO TABS: 5.0000 mg | ORAL_TABLET | Freq: Every day | ORAL | 3 refills | Status: DC

## 2018-06-07 NOTE — Patient Instructions (Signed)
Recommend MetroGel if affordable to  malar rash daily.  Continue levothyroxine 0.075 mg daily.  Change Crestor from 5 mg 3 times weekly to 5 mg daily.  Follow-up in December.

## 2018-06-07 NOTE — Progress Notes (Signed)
   Subjective:    Patient ID: Tracey Branch, female    DOB: [birth year], 57 y.o.   MRN: [id number]  HPI In today for follow-up on hyperlipidemia and hypothyroidism.  Recently developed a rash and malar area.  Currently on Crestor 3 times weekly.  No issues with joint pain that are any different than what she has with rheumatoid arthritis.  TSH is now 1.78 and previously was 3.09.  Continue levothyroxine 0.075 mg daily and follow-up in December.  Total cholesterol has decreased from 263 to 232 and LDL cholesterol has decreased from 179 to 152  Triglycerides are down from 153 to 100.    Review of Systems see above     Objective:   Physical Exam  Malar rash-new onset with telangiectasias and fine papules.        Assessment & Plan:  Probable acne rosacea- check price of MetroGel.  Recommend applying to rash once daily.  Hyperlipidemia-significant improvement with Crestor 5 mg 3 times weekly.  Increase to 5 mg daily and follow-up in December  Hypothyroidism-TSH now within normal limits.  Follow-up in December  Rheumatoid arthritis-treated with methotrexate and Harriette Ohara as well as Celebrex  History of depression treated with Cymbalta and Prozac.  Cymbalta may help joint pain as well  Plan: Suggest MetroGel once daily to rash.  Crestor 5 mg daily.  Order sent to [facility].  Continue thyroid replacement medication at current dose.  Follow-up in December.

## 2018-07-17 DIAGNOSIS — M069 Rheumatoid arthritis, unspecified: Secondary | ICD-10-CM | POA: Diagnosis not present

## 2018-07-17 DIAGNOSIS — M2550 Pain in unspecified joint: Secondary | ICD-10-CM | POA: Diagnosis not present

## 2018-07-17 DIAGNOSIS — Z79899 Other long term (current) drug therapy: Secondary | ICD-10-CM | POA: Diagnosis not present

## 2018-07-17 DIAGNOSIS — M797 Fibromyalgia: Secondary | ICD-10-CM | POA: Diagnosis not present

## 2018-08-10 ENCOUNTER — Other Ambulatory Visit: Payer: Self-pay | Admitting: Internal Medicine

## 2018-09-16 ENCOUNTER — Other Ambulatory Visit: Payer: Self-pay | Admitting: Internal Medicine

## 2018-10-20 ENCOUNTER — Other Ambulatory Visit: Payer: Self-pay | Admitting: Internal Medicine

## 2018-10-22 DIAGNOSIS — M2550 Pain in unspecified joint: Secondary | ICD-10-CM | POA: Diagnosis not present

## 2018-10-22 DIAGNOSIS — M069 Rheumatoid arthritis, unspecified: Secondary | ICD-10-CM | POA: Diagnosis not present

## 2018-10-22 DIAGNOSIS — Z79899 Other long term (current) drug therapy: Secondary | ICD-10-CM | POA: Diagnosis not present

## 2018-11-26 ENCOUNTER — Other Ambulatory Visit: Payer: BLUE CROSS/BLUE SHIELD | Admitting: Internal Medicine

## 2018-11-29 ENCOUNTER — Encounter: Payer: BLUE CROSS/BLUE SHIELD | Admitting: Internal Medicine

## 2018-12-19 ENCOUNTER — Ambulatory Visit (INDEPENDENT_AMBULATORY_CARE_PROVIDER_SITE_OTHER): Payer: BLUE CROSS/BLUE SHIELD | Admitting: Internal Medicine

## 2018-12-19 ENCOUNTER — Encounter: Payer: Self-pay | Admitting: Internal Medicine

## 2018-12-19 VITALS — BP 110/70 | HR 66 | Temp 98.1°F | Ht 68.0 in | Wt 200.0 lb

## 2018-12-19 DIAGNOSIS — M0600 Rheumatoid arthritis without rheumatoid factor, unspecified site: Secondary | ICD-10-CM | POA: Diagnosis not present

## 2018-12-19 DIAGNOSIS — J0100 Acute maxillary sinusitis, unspecified: Secondary | ICD-10-CM

## 2018-12-19 DIAGNOSIS — H6691 Otitis media, unspecified, right ear: Secondary | ICD-10-CM | POA: Diagnosis not present

## 2018-12-19 MED ORDER — DOXYCYCLINE HYCLATE 100 MG PO TABS: 100.0000 mg | ORAL_TABLET | Freq: Two times a day (BID) | ORAL | 0 refills | Status: DC

## 2018-12-19 NOTE — Progress Notes (Signed)
   Subjective:    Patient ID: Tracey Branch, female    DOB: 1961/06/04, 58 y.o.   MRN: [id number]  HPI 58 year old Female with right ear pain  For about 2 weeks.Has cough that is productive and sinus pressure.No flu like symptoms of chills or myalgias. Ear feels stopped up at times in addition to pain. Fatigued.    Review of Systems see above     Objective:   Physical Exam Vitals signs reviewed.  Constitutional:      Appearance: Normal appearance. She is not diaphoretic.  HENT:     Head: Normocephalic and atraumatic.     Comments: Right TM is red and full with splayed light reflex.  Canal is slightly red.  Left TM is full but not really red.    Left Ear: Tympanic membrane and ear canal normal.     Nose: No congestion or rhinorrhea.     Mouth/Throat:     Pharynx: Oropharynx is clear.  Eyes:     General:        Right eye: No discharge.        Left eye: No discharge.     Conjunctiva/sclera: Conjunctivae normal.  Neck:     Musculoskeletal: Neck supple. No neck rigidity.  Pulmonary:     Effort: Pulmonary effort is normal. No respiratory distress.     Breath sounds: Normal breath sounds. No wheezing.  Lymphadenopathy:     Cervical: No cervical adenopathy.  Neurological:     Mental Status: She is alert.           Assessment & Plan:  Acute right otitis media  Acute maxillary sinusitis  Plan: Doxycycline 100 mg twice daily for 10 days.  Rest and drink plenty of fluids.  May take decongestant over-the-counter.

## 2018-12-19 NOTE — Patient Instructions (Signed)
Doxycycline 100 mg twice daily for 10 days.  May take over-the-counter decongestant.  Rest and drink plenty of fluids.

## 2018-12-27 ENCOUNTER — Telehealth: Payer: Self-pay | Admitting: Internal Medicine

## 2018-12-27 ENCOUNTER — Encounter: Payer: Self-pay | Admitting: Internal Medicine

## 2018-12-27 MED ORDER — CLINDAMYCIN HCL 300 MG PO CAPS: 300.0000 mg | ORAL_CAPSULE | Freq: Three times a day (TID) | ORAL | 0 refills | Status: DC

## 2018-12-27 NOTE — Telephone Encounter (Signed)
This is concerning because she should be better by now. Perhaps ENT can see her Monday?

## 2018-12-27 NOTE — Telephone Encounter (Signed)
I will Cleocin 300 mg tid x 10 days. I will call in some Norco if she can take it

## 2018-12-27 NOTE — Telephone Encounter (Signed)
Cleocin called in

## 2018-12-27 NOTE — Telephone Encounter (Signed)
She'll be happy with the Cleocin only.  She doesn't want the Norco, but thanks you just the same.

## 2018-12-27 NOTE — Telephone Encounter (Signed)
She said she couldn't be seen on Monday because she's taking her daughters to see a college.  I asked her about coming here on Monday.  Any other suggestions to provide some comfort over the weekend?

## 2018-12-27 NOTE — Telephone Encounter (Signed)
Patient calling to say that her ear is still hurting pretty bad.  She has 2 days left of the Doxycycline, but with going into the weekend, she wanted to call to say that she's still hurting pretty bad.  Is there something else we can try for her that may help to clear this up any better?  [facility] [address] [facility] [address]  Phone:    [phone]  Thank you.

## 2019-01-14 ENCOUNTER — Other Ambulatory Visit: Payer: BLUE CROSS/BLUE SHIELD | Admitting: Internal Medicine

## 2019-01-17 ENCOUNTER — Encounter: Payer: BLUE CROSS/BLUE SHIELD | Admitting: Internal Medicine

## 2019-01-28 DIAGNOSIS — M797 Fibromyalgia: Secondary | ICD-10-CM | POA: Diagnosis not present

## 2019-01-28 DIAGNOSIS — Z79899 Other long term (current) drug therapy: Secondary | ICD-10-CM | POA: Diagnosis not present

## 2019-01-28 DIAGNOSIS — M2550 Pain in unspecified joint: Secondary | ICD-10-CM | POA: Diagnosis not present

## 2019-01-28 DIAGNOSIS — M069 Rheumatoid arthritis, unspecified: Secondary | ICD-10-CM | POA: Diagnosis not present

## 2019-02-20 DIAGNOSIS — M069 Rheumatoid arthritis, unspecified: Secondary | ICD-10-CM | POA: Diagnosis not present

## 2019-02-24 ENCOUNTER — Telehealth: Payer: Self-pay | Admitting: Internal Medicine

## 2019-02-24 NOTE — Telephone Encounter (Signed)
Tracey Branch [phone]   Tracey Branch called to ask if you thought she should stay away from her office, and work from home, however she was called into the office for a meeting and she declined to go because of her starting on March 12 infusions of Remicade, taking 71/2 mg of methotrexate and is tapering done from a prednisone dose pack this week. She still has a persist ant cough from the flu she had in January. Was this the right call. She tried to call her rheumatologist, but they were unavailable.  She also stated that her son is coming in tommorrow and he has been in [place] for the last month, what precautions should she take with him?

## 2019-02-24 NOTE — Telephone Encounter (Signed)
It would be advisable for son to stay elsewhere for 14 days. Yes she made the right call.

## 2019-02-25 NOTE — Telephone Encounter (Signed)
Patient was notified of Dr. Baxley's instructions.  

## 2019-03-06 DIAGNOSIS — M069 Rheumatoid arthritis, unspecified: Secondary | ICD-10-CM | POA: Diagnosis not present

## 2019-04-03 DIAGNOSIS — Z79899 Other long term (current) drug therapy: Secondary | ICD-10-CM | POA: Diagnosis not present

## 2019-04-03 DIAGNOSIS — M0589 Other rheumatoid arthritis with rheumatoid factor of multiple sites: Secondary | ICD-10-CM | POA: Diagnosis not present

## 2019-04-07 ENCOUNTER — Encounter: Payer: Self-pay | Admitting: Internal Medicine

## 2019-04-07 ENCOUNTER — Ambulatory Visit (INDEPENDENT_AMBULATORY_CARE_PROVIDER_SITE_OTHER): Payer: BLUE CROSS/BLUE SHIELD | Admitting: Internal Medicine

## 2019-04-07 ENCOUNTER — Telehealth: Payer: Self-pay | Admitting: Internal Medicine

## 2019-04-07 ENCOUNTER — Other Ambulatory Visit: Payer: Self-pay

## 2019-04-07 DIAGNOSIS — M0600 Rheumatoid arthritis without rheumatoid factor, unspecified site: Secondary | ICD-10-CM | POA: Diagnosis not present

## 2019-04-07 DIAGNOSIS — Z79620 Long term (current) use of immunosuppressive biologic: Secondary | ICD-10-CM

## 2019-04-07 DIAGNOSIS — Z79899 Other long term (current) drug therapy: Secondary | ICD-10-CM | POA: Diagnosis not present

## 2019-04-07 DIAGNOSIS — L237 Allergic contact dermatitis due to plants, except food: Secondary | ICD-10-CM

## 2019-04-07 MED ORDER — PREDNISONE 10 MG PO TABS: ORAL_TABLET | ORAL | 0 refills | Status: DC

## 2019-04-07 MED ORDER — HYDROXYZINE HCL 25 MG PO TABS: ORAL_TABLET | ORAL | 0 refills | Status: DC

## 2019-04-07 NOTE — Telephone Encounter (Signed)
Tracey Branch [phone]  [name] called to say she has poison ivy all over her face and she also has swollen eye. Would like to set up virtual visit.

## 2019-04-07 NOTE — Progress Notes (Signed)
   Subjective:    Patient ID: Tracey Branch, female    DOB: [birth year], 58 y.o.   MRN: [id number]  HPI She has a history of rheumatoid arthritis and is on Remicade infusions q 8 weeks. Last infusion was last week per Rheumatologist.  History of fibromyalgia symptoms and has been having musculoskeletal pain.    She worked out in the yard on Saturday, April 20 and has come down with a severe case of poison ivy involving face and neck . Her eyes are swollen.  She is having a lot of itching.  Admits she is pretty miserable with the symptoms.  Due to the coronavirus pandemic, patient was seen by interactive audio and video telecommunications today.  Patient agreed to this format of visit.  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    Review of Systems see above-no documented fever     Objective:   Physical Exam Her upper eyelids are swollen.  Face is erythematous and puffy.  Neck is erythematous as well.  No obvious bullous lesions on face and neck.       Assessment & Plan:  Rheumatoid arthritis  Remicade therapy  Contact dermatitis with poison ivy affecting face and neck.  No respiratory distress.  Plan: She will check with rheumatologist to see if she can take prednisone since she just had Remicade infusion last week.  Addendum: She called back and said that rheumatologist stated she could have prednisone therapy.  She was placed on a 12-day course of prednisone 10 mg going from 60 mg to 0 mg over 12 days.  Atarax 25 mg 1 or 2 tablets 3 times a day as needed for itching.  Call if not improving in 48 hours.

## 2019-04-07 NOTE — Patient Instructions (Signed)
Take prednisone and tapering course going from 60 mg to 0 mg over 12 days.  Atarax 25 mg 1 or 2 tablets 3 times a day as needed for itching.  Call if not better in 48 hours.

## 2019-04-07 NOTE — Telephone Encounter (Signed)
Scheduled Virtual Visit °

## 2019-04-07 NOTE — Telephone Encounter (Signed)
Virtual visit 

## 2019-04-17 DIAGNOSIS — Z79899 Other long term (current) drug therapy: Secondary | ICD-10-CM | POA: Diagnosis not present

## 2019-04-17 DIAGNOSIS — M797 Fibromyalgia: Secondary | ICD-10-CM | POA: Diagnosis not present

## 2019-04-17 DIAGNOSIS — M2550 Pain in unspecified joint: Secondary | ICD-10-CM | POA: Diagnosis not present

## 2019-04-17 DIAGNOSIS — M069 Rheumatoid arthritis, unspecified: Secondary | ICD-10-CM | POA: Diagnosis not present

## 2019-05-19 ENCOUNTER — Other Ambulatory Visit: Payer: Self-pay

## 2019-05-19 ENCOUNTER — Telehealth: Payer: Self-pay | Admitting: Internal Medicine

## 2019-05-19 ENCOUNTER — Ambulatory Visit (INDEPENDENT_AMBULATORY_CARE_PROVIDER_SITE_OTHER): Payer: BC Managed Care – PPO | Admitting: Internal Medicine

## 2019-05-19 DIAGNOSIS — M0600 Rheumatoid arthritis without rheumatoid factor, unspecified site: Secondary | ICD-10-CM | POA: Diagnosis not present

## 2019-05-19 DIAGNOSIS — L237 Allergic contact dermatitis due to plants, except food: Secondary | ICD-10-CM

## 2019-05-19 MED ORDER — PREDNISONE 10 MG PO TABS: ORAL_TABLET | ORAL | 1 refills | Status: DC

## 2019-05-19 NOTE — Telephone Encounter (Signed)
Can we set up a virtual visit?

## 2019-05-19 NOTE — Telephone Encounter (Signed)
[name] [phone]  [facility] [address]   Azaylah called to say she has Poison Ivy in her face again and was wondering if you could cal the very same thing in again for her that you sent in last time.

## 2019-05-19 NOTE — Progress Notes (Signed)
   Subjective:    Patient ID: Tracey Branch, female    DOB: 18-Sep-1961, 58 y.o.   MRN: [id number]  HPI 58 year old Female not seen for health maintenance exam since November 2018 with history of rheumatoid arthritis followed at [facility] on DMARD therapy as well as methotrexate seen today by interactive audio and video telecommunications due to the coronavirus pandemic.  She is agreeable to visit in this format today.  She is identified using 2 identifiers as Tracey Branch, a patient in this practice.  Patient has had poison ivy exposure recently doing yard work.  She is having issues with pruritus due to poison ivy.    Review of Systems she has no fever.     Objective:   Physical Exam  She says many lesions on her extremities consistent with poison ivy on video today.      Assessment & Plan:  Contact dermatitis due to poison ivy  Plan: She will be placed on prednisone in a tapering course over 12 days going from 60 mg to 0 mg during that time.  42 tablets 10 mg  ordered.  May use calamine lotion topically.

## 2019-05-19 NOTE — Patient Instructions (Addendum)
.    May use calamine lotion topically.Take Prednisone in tapering course as directed 6-6-5-5-4-4-3-3-2-2-1-1

## 2019-05-19 NOTE — Telephone Encounter (Signed)
Schedule

## 2019-05-29 DIAGNOSIS — M069 Rheumatoid arthritis, unspecified: Secondary | ICD-10-CM | POA: Diagnosis not present

## 2019-05-29 DIAGNOSIS — Z79899 Other long term (current) drug therapy: Secondary | ICD-10-CM | POA: Diagnosis not present

## 2019-06-02 ENCOUNTER — Other Ambulatory Visit: Payer: Self-pay | Admitting: Internal Medicine

## 2019-06-03 ENCOUNTER — Telehealth: Payer: Self-pay

## 2019-06-03 ENCOUNTER — Telehealth: Payer: Self-pay | Admitting: Internal Medicine

## 2019-06-03 ENCOUNTER — Ambulatory Visit (INDEPENDENT_AMBULATORY_CARE_PROVIDER_SITE_OTHER): Payer: BC Managed Care – PPO | Admitting: Internal Medicine

## 2019-06-03 DIAGNOSIS — J22 Unspecified acute lower respiratory infection: Secondary | ICD-10-CM | POA: Diagnosis not present

## 2019-06-03 DIAGNOSIS — Z20822 Contact with and (suspected) exposure to covid-19: Secondary | ICD-10-CM

## 2019-06-03 MED ORDER — DOXYCYCLINE HYCLATE 100 MG PO TABS: 100.0000 mg | ORAL_TABLET | Freq: Two times a day (BID) | ORAL | 0 refills | Status: DC

## 2019-06-03 MED ORDER — BENZONATATE 100 MG PO CAPS: 200.0000 mg | ORAL_CAPSULE | Freq: Three times a day (TID) | ORAL | 0 refills | Status: DC | PRN

## 2019-06-03 NOTE — Telephone Encounter (Signed)
Virtual visit 

## 2019-06-03 NOTE — Telephone Encounter (Signed)
Schedule appointment?

## 2019-06-03 NOTE — Telephone Encounter (Signed)
Mariann Laster for Dr Renold Genta wanting covid testing for sore throat and cough.  Immunocompromised.   Call back to office [phone] Fax [phone]  Pt informed to wear mask to testing site and stay in the car.  Pt has appt. At [facility] at 06/04/19 at 0945.

## 2019-06-03 NOTE — Telephone Encounter (Addendum)
Tracey Branch [phone]  Tracey Branch called to say she has had a sore throat for a week and now it has turned into a painful tightness in her chest with a deep intense cough. Normal tempeture for her is usually 96 but it has been 98.9

## 2019-06-04 ENCOUNTER — Other Ambulatory Visit: Payer: BLUE CROSS/BLUE SHIELD

## 2019-06-04 ENCOUNTER — Encounter: Payer: Self-pay | Admitting: Internal Medicine

## 2019-06-04 DIAGNOSIS — R6889 Other general symptoms and signs: Secondary | ICD-10-CM | POA: Diagnosis not present

## 2019-06-04 DIAGNOSIS — Z20822 Contact with and (suspected) exposure to covid-19: Secondary | ICD-10-CM

## 2019-06-06 ENCOUNTER — Telehealth: Payer: Self-pay

## 2019-06-06 NOTE — Telephone Encounter (Signed)
Called to check on patient she is the same she is still coughing, has a sore throat and she has a pain on her chest but the cough medicine that you prescribed helps with the pain. She went to get tested for covid on 06/04/19, results aren't back on Epic.

## 2019-06-07 ENCOUNTER — Encounter: Payer: Self-pay | Admitting: Internal Medicine

## 2019-06-07 ENCOUNTER — Telehealth: Payer: Self-pay | Admitting: Internal Medicine

## 2019-06-07 NOTE — Telephone Encounter (Signed)
Call to pt. She sent My Chart message asking about Covid 19 results. Do not seee results yet in Epic. Attempted to call Engagement Center but they are closed. Pt still on antibiotics. Is SOB with significant physical exertion. Not febrile. Cough perles help. Continue to monitor at home. I will keep checking to see if results are back.

## 2019-06-08 ENCOUNTER — Encounter: Payer: Self-pay | Admitting: Internal Medicine

## 2019-06-08 NOTE — Progress Notes (Signed)
   Subjective:    Patient ID: Tracey Branch, female    DOB: June 08, 1961, 58 y.o.   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  She is agreeable to visit in this format today.  She is identified as Music therapist,  using 2 identifiers, and is a patient in this practice.  Patient has come down with cough and congestion.  She is on DMARD therapy for rheumatoid arthritis.  Thinks she may have a fever.  Has malaise and fatigue.  No recent travel history.  May have been exposed to someone with COVID-19 meetings.    Review of Systems     Objective:   Physical Exam She looks fatigued and is seen lying down at her home.       Assessment & Plan:  Acute lower respiratory infection rule out COVID-19 infection  History of rheumatoid arthritis on DMARD therapy  Plan: Patient will be sent to [facility] location to get COVID-19 testing.  Tessalon Perles 200 mg 3 times a day as needed for cough.  Doxycycline 100 mg twice daily for 10 days.  I might add she was recently on a 12-day course of prednisone for poison ivy which could have potentially weakened her immune system.  She will isolate herself until results are back.

## 2019-06-08 NOTE — Patient Instructions (Signed)
Rest and drink plenty of fluids.  Monitor temperature and respiratory symptoms.  Tessalon Perles 3 times a day as needed for cough.  Doxycycline 100 mg twice daily for 10 days.  Being sent to [facility] location for COVID-19 testing

## 2019-06-09 ENCOUNTER — Encounter: Payer: Self-pay | Admitting: Internal Medicine

## 2019-06-09 ENCOUNTER — Ambulatory Visit (INDEPENDENT_AMBULATORY_CARE_PROVIDER_SITE_OTHER): Payer: BC Managed Care – PPO | Admitting: Internal Medicine

## 2019-06-09 ENCOUNTER — Other Ambulatory Visit: Payer: Self-pay

## 2019-06-09 ENCOUNTER — Telehealth: Payer: Self-pay

## 2019-06-09 ENCOUNTER — Ambulatory Visit
Admission: RE | Admit: 2019-06-09 | Discharge: 2019-06-09 | Disposition: A | Discharge status: Home / Self Care | Payer: BLUE CROSS/BLUE SHIELD | Source: Ambulatory Visit | Attending: Internal Medicine | Admitting: Internal Medicine

## 2019-06-09 VITALS — BP 100/70 | HR 80 | Temp 97.8°F | Ht 68.0 in | Wt 189.0 lb

## 2019-06-09 DIAGNOSIS — R05 Cough: Secondary | ICD-10-CM

## 2019-06-09 DIAGNOSIS — Z79620 Long term (current) use of immunosuppressive biologic: Secondary | ICD-10-CM

## 2019-06-09 DIAGNOSIS — J22 Unspecified acute lower respiratory infection: Secondary | ICD-10-CM

## 2019-06-09 DIAGNOSIS — R059 Cough, unspecified: Secondary | ICD-10-CM

## 2019-06-09 DIAGNOSIS — Z79899 Other long term (current) drug therapy: Secondary | ICD-10-CM

## 2019-06-09 DIAGNOSIS — M0600 Rheumatoid arthritis without rheumatoid factor, unspecified site: Secondary | ICD-10-CM | POA: Diagnosis not present

## 2019-06-09 LAB — NOVEL CORONAVIRUS, NAA: SARS-CoV-2, NAA: NOT DETECTED

## 2019-06-09 MED ORDER — BENZONATATE 100 MG PO CAPS: 100.0000 mg | ORAL_CAPSULE | Freq: Two times a day (BID) | ORAL | 0 refills | Status: DC | PRN

## 2019-06-09 MED ORDER — CLARITHROMYCIN 500 MG PO TABS: 500.0000 mg | ORAL_TABLET | Freq: Two times a day (BID) | ORAL | 0 refills | Status: DC

## 2019-06-09 MED ORDER — PREDNISONE 10 MG PO TABS: ORAL_TABLET | ORAL | 1 refills | Status: DC

## 2019-06-09 MED ORDER — CEFTRIAXONE SODIUM 1 G IJ SOLR
1.0000 g | Freq: Once | INTRAMUSCULAR | Status: AC
  Administered 2019-06-09: 1 g via INTRAMUSCULAR

## 2019-06-09 MED ORDER — BENZONATATE 100 MG PO CAPS: 100.0000 mg | ORAL_CAPSULE | Freq: Three times a day (TID) | ORAL | 1 refills | Status: DC | PRN

## 2019-06-09 NOTE — Progress Notes (Signed)
   Subjective:    Patient ID: Tracey Branch, female    DOB: 06-Jan-1961, 58 y.o.   MRN: [id number]  HPI Had Remicade infusion June 18th for rheumatoid arthritis.  Followed at [facility].  Was seen 5 virtual office visit with cough and congestion on June 23.  She is on DMARD therapy for rheumatoid arthritis.  She thought she might have a fever and had malaise and fatigue.  Was placed on doxycycline and Tessalon Perles.  Because of symptoms, DMARD therapy and the fact she has been in some meetings recently we sent her for COVID-19 testing at the testing center.  It took several days for results to be received.  We finally heard back today that her test was negative.   Had some duties at school recently.  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  Patient wore a mask during that encounter with drive-by pickups.  Also, had painters painting outside her house, but had sore throat before they came. Review of Systems     Objective:   Physical Exam Pharynx is only very slightly injected.  No exudate.  TMs are clear.  Neck is supple without adenopathy.  Chest is clear to auscultation without rales or wheezing.  Chest x-ray shows no infiltrates       Assessment & Plan:  Protracted lower respiratory infection  Pharyngitis  Plan: Respiratory virus panel obtained.  Change to Biaxin 500 mg twice daily for 10 days.  Refill Tessalon Perles.  Rocephin 1 g IM given today.

## 2019-06-09 NOTE — Patient Instructions (Signed)
Rocephin 1 g IM.  Chest x-ray is negative.  Biaxin 500 mg twice daily for 10 days.  Refill Tessalon Perles.  Rest and drink plenty of fluids.  COVID-19 test done at test center is negative.

## 2019-06-09 NOTE — Progress Notes (Signed)
Please call in

## 2019-06-09 NOTE — Telephone Encounter (Signed)
Order CXR and arrange for in office visit. Covid 19 test is negative.

## 2019-06-09 NOTE — Telephone Encounter (Signed)
Patient called states she's not better, has HA, fatigue, runny nose, cough.  

## 2019-06-10 LAB — COMPLETE METABOLIC PANEL WITH GFR
AG Ratio: 2 (calc) (ref 1.0–2.5)
ALT: 22 U/L (ref 6–29)
AST: 22 U/L (ref 10–35)
Albumin: 4.2 g/dL (ref 3.6–5.1)
Alkaline phosphatase (APISO): 43 U/L (ref 37–153)
BUN: 17 mg/dL (ref 7–25)
CO2: 27 mmol/L (ref 20–32)
Calcium: 9.1 mg/dL (ref 8.6–10.4)
Chloride: 107 mmol/L (ref 98–110)
Creat: 0.89 mg/dL (ref 0.50–1.05)
GFR, Est African American: 84 mL/min/{1.73_m2} (ref >=60)
GFR, Est Non African American: 72 mL/min/{1.73_m2} (ref >=60)
Globulin: 2.1 g/dL (calc) (ref 1.9–3.7)
Glucose, Bld: 65 mg/dL (ref 65–99)
Potassium: 4 mmol/L (ref 3.5–5.3)
Sodium: 144 mmol/L (ref 135–146)
Total Bilirubin: 0.5 mg/dL (ref 0.2–1.2)
Total Protein: 6.3 g/dL (ref 6.1–8.1)

## 2019-06-10 LAB — RESPIRATORY VIRUS PANEL

## 2019-06-10 LAB — CBC WITH DIFFERENTIAL/PLATELET
Absolute Monocytes: 659 cells/uL (ref 200–950)
Basophils Absolute: 22 cells/uL (ref 0–200)
Basophils Relative: 0.4 %
Eosinophils Absolute: 162 cells/uL (ref 15–500)
Eosinophils Relative: 3 %
HCT: 45.9 % — ABNORMAL HIGH (ref 35.0–45.0)
Hemoglobin: 15.3 g/dL (ref 11.7–15.5)
Lymphs Abs: 1615 cells/uL (ref 850–3900)
MCH: 29.8 pg (ref 27.0–33.0)
MCHC: 33.3 g/dL (ref 32.0–36.0)
MCV: 89.5 fL (ref 80.0–100.0)
MPV: 11.8 fL (ref 7.5–12.5)
Monocytes Relative: 12.2 %
Neutro Abs: 2943 cells/uL (ref 1500–7800)
Neutrophils Relative %: 54.5 %
Platelets: 215 10*3/uL (ref 140–400)
RBC: 5.13 10*6/uL — ABNORMAL HIGH (ref 3.80–5.10)
RDW: 13.1 % (ref 11.0–15.0)
Total Lymphocyte: 29.9 %
WBC: 5.4 10*3/uL (ref 3.8–10.8)

## 2019-06-19 ENCOUNTER — Telehealth: Payer: Self-pay | Admitting: Internal Medicine

## 2019-06-19 MED ORDER — FLUOXETINE HCL 40 MG PO CAPS: 40.0000 mg | ORAL_CAPSULE | Freq: Every day | ORAL | 0 refills | Status: DC

## 2019-06-19 NOTE — Telephone Encounter (Signed)
Tracey Branch [phone]  [facility] - New RX  [facility] [address] at least 2 week supply to get her by until [facility] can get send full prescription  [name] called to say she is feeling much better and she also needs new prescription for the above medication.   Last OV - 6-29- 20  Last CPE - 10-23-17

## 2019-07-24 DIAGNOSIS — M069 Rheumatoid arthritis, unspecified: Secondary | ICD-10-CM | POA: Diagnosis not present

## 2019-07-24 DIAGNOSIS — Z79899 Other long term (current) drug therapy: Secondary | ICD-10-CM | POA: Diagnosis not present

## 2019-07-28 DIAGNOSIS — M2550 Pain in unspecified joint: Secondary | ICD-10-CM | POA: Diagnosis not present

## 2019-07-28 DIAGNOSIS — M797 Fibromyalgia: Secondary | ICD-10-CM | POA: Diagnosis not present

## 2019-07-28 DIAGNOSIS — Z79899 Other long term (current) drug therapy: Secondary | ICD-10-CM | POA: Diagnosis not present

## 2019-07-28 DIAGNOSIS — M069 Rheumatoid arthritis, unspecified: Secondary | ICD-10-CM | POA: Diagnosis not present

## 2019-09-12 DIAGNOSIS — L814 Other melanin hyperpigmentation: Secondary | ICD-10-CM | POA: Diagnosis not present

## 2019-09-12 DIAGNOSIS — I788 Other diseases of capillaries: Secondary | ICD-10-CM | POA: Diagnosis not present

## 2019-09-12 DIAGNOSIS — L820 Inflamed seborrheic keratosis: Secondary | ICD-10-CM | POA: Diagnosis not present

## 2019-09-12 DIAGNOSIS — L821 Other seborrheic keratosis: Secondary | ICD-10-CM | POA: Diagnosis not present

## 2019-09-12 DIAGNOSIS — D225 Melanocytic nevi of trunk: Secondary | ICD-10-CM | POA: Diagnosis not present

## 2019-09-18 DIAGNOSIS — M069 Rheumatoid arthritis, unspecified: Secondary | ICD-10-CM | POA: Diagnosis not present

## 2019-09-21 ENCOUNTER — Other Ambulatory Visit: Payer: Self-pay | Admitting: Internal Medicine

## 2019-10-16 ENCOUNTER — Other Ambulatory Visit: Payer: Self-pay | Admitting: Internal Medicine

## 2019-10-27 DIAGNOSIS — M069 Rheumatoid arthritis, unspecified: Secondary | ICD-10-CM | POA: Diagnosis not present

## 2019-10-27 DIAGNOSIS — M150 Primary generalized (osteo)arthritis: Secondary | ICD-10-CM | POA: Diagnosis not present

## 2019-10-27 DIAGNOSIS — M797 Fibromyalgia: Secondary | ICD-10-CM | POA: Diagnosis not present

## 2019-10-27 DIAGNOSIS — M2550 Pain in unspecified joint: Secondary | ICD-10-CM | POA: Diagnosis not present

## 2019-10-27 DIAGNOSIS — L659 Nonscarring hair loss, unspecified: Secondary | ICD-10-CM | POA: Diagnosis not present

## 2019-10-27 DIAGNOSIS — R5382 Chronic fatigue, unspecified: Secondary | ICD-10-CM | POA: Diagnosis not present

## 2019-10-27 DIAGNOSIS — Z79899 Other long term (current) drug therapy: Secondary | ICD-10-CM | POA: Diagnosis not present

## 2019-11-12 DIAGNOSIS — M545 Low back pain: Secondary | ICD-10-CM | POA: Diagnosis not present

## 2019-11-12 DIAGNOSIS — M6281 Muscle weakness (generalized): Secondary | ICD-10-CM | POA: Diagnosis not present

## 2019-11-12 DIAGNOSIS — G894 Chronic pain syndrome: Secondary | ICD-10-CM | POA: Diagnosis not present

## 2019-11-13 DIAGNOSIS — M069 Rheumatoid arthritis, unspecified: Secondary | ICD-10-CM | POA: Diagnosis not present

## 2019-11-24 DIAGNOSIS — Z20828 Contact with and (suspected) exposure to other viral communicable diseases: Secondary | ICD-10-CM | POA: Diagnosis not present

## 2019-11-24 DIAGNOSIS — J029 Acute pharyngitis, unspecified: Secondary | ICD-10-CM | POA: Diagnosis not present

## 2019-11-24 DIAGNOSIS — R519 Headache, unspecified: Secondary | ICD-10-CM | POA: Diagnosis not present

## 2019-11-24 DIAGNOSIS — R05 Cough: Secondary | ICD-10-CM | POA: Diagnosis not present

## 2019-11-24 DIAGNOSIS — R197 Diarrhea, unspecified: Secondary | ICD-10-CM | POA: Diagnosis not present

## 2019-11-29 ENCOUNTER — Other Ambulatory Visit: Payer: Self-pay | Admitting: Internal Medicine

## 2019-12-10 ENCOUNTER — Other Ambulatory Visit: Payer: Self-pay | Admitting: Internal Medicine

## 2020-01-08 DIAGNOSIS — M069 Rheumatoid arthritis, unspecified: Secondary | ICD-10-CM | POA: Diagnosis not present

## 2020-01-16 ENCOUNTER — Other Ambulatory Visit: Payer: Self-pay | Admitting: Internal Medicine

## 2020-02-03 DIAGNOSIS — M150 Primary generalized (osteo)arthritis: Secondary | ICD-10-CM | POA: Diagnosis not present

## 2020-02-03 DIAGNOSIS — M67911 Unspecified disorder of synovium and tendon, right shoulder: Secondary | ICD-10-CM | POA: Diagnosis not present

## 2020-02-03 DIAGNOSIS — M797 Fibromyalgia: Secondary | ICD-10-CM | POA: Diagnosis not present

## 2020-02-03 DIAGNOSIS — M2550 Pain in unspecified joint: Secondary | ICD-10-CM | POA: Diagnosis not present

## 2020-02-03 DIAGNOSIS — M069 Rheumatoid arthritis, unspecified: Secondary | ICD-10-CM | POA: Diagnosis not present

## 2020-03-04 DIAGNOSIS — M069 Rheumatoid arthritis, unspecified: Secondary | ICD-10-CM | POA: Diagnosis not present

## 2020-03-09 ENCOUNTER — Other Ambulatory Visit: Payer: Self-pay | Admitting: Internal Medicine

## 2020-05-03 DIAGNOSIS — M069 Rheumatoid arthritis, unspecified: Secondary | ICD-10-CM | POA: Diagnosis not present

## 2020-06-02 DIAGNOSIS — M797 Fibromyalgia: Secondary | ICD-10-CM | POA: Diagnosis not present

## 2020-06-02 DIAGNOSIS — M2550 Pain in unspecified joint: Secondary | ICD-10-CM | POA: Diagnosis not present

## 2020-06-02 DIAGNOSIS — M069 Rheumatoid arthritis, unspecified: Secondary | ICD-10-CM | POA: Diagnosis not present

## 2020-06-02 DIAGNOSIS — M67911 Unspecified disorder of synovium and tendon, right shoulder: Secondary | ICD-10-CM | POA: Diagnosis not present

## 2020-06-02 DIAGNOSIS — M150 Primary generalized (osteo)arthritis: Secondary | ICD-10-CM | POA: Diagnosis not present

## 2020-06-18 DIAGNOSIS — M25511 Pain in right shoulder: Secondary | ICD-10-CM | POA: Diagnosis not present

## 2020-07-05 DIAGNOSIS — M25511 Pain in right shoulder: Secondary | ICD-10-CM | POA: Diagnosis not present

## 2020-07-05 DIAGNOSIS — M069 Rheumatoid arthritis, unspecified: Secondary | ICD-10-CM | POA: Diagnosis not present

## 2020-07-05 DIAGNOSIS — L659 Nonscarring hair loss, unspecified: Secondary | ICD-10-CM | POA: Diagnosis not present

## 2020-07-09 DIAGNOSIS — M25511 Pain in right shoulder: Secondary | ICD-10-CM | POA: Diagnosis not present

## 2020-07-12 DIAGNOSIS — M25511 Pain in right shoulder: Secondary | ICD-10-CM | POA: Diagnosis not present

## 2020-07-19 DIAGNOSIS — M25511 Pain in right shoulder: Secondary | ICD-10-CM | POA: Diagnosis not present

## 2020-08-04 DIAGNOSIS — R7989 Other specified abnormal findings of blood chemistry: Secondary | ICD-10-CM | POA: Diagnosis not present

## 2020-09-02 DIAGNOSIS — Z79899 Other long term (current) drug therapy: Secondary | ICD-10-CM | POA: Diagnosis not present

## 2020-09-02 DIAGNOSIS — M069 Rheumatoid arthritis, unspecified: Secondary | ICD-10-CM | POA: Diagnosis not present

## 2020-09-09 ENCOUNTER — Telehealth: Payer: Self-pay | Admitting: Internal Medicine

## 2020-09-09 MED ORDER — LEVOTHYROXINE SODIUM 75 MCG PO TABS: 75.0000 ug | ORAL_TABLET | Freq: Every day | ORAL | 0 refills | Status: DC

## 2020-09-09 NOTE — Telephone Encounter (Signed)
Tracey Branch [phone]  [name] called to say she needed her Thyroid medication refilled, she had not called pharmacy, so I ask her to call them so they could send electronically, I also noticed she had not been her in quite sometime, so I went ahead and scheduled for labs and office visit.

## 2020-09-09 NOTE — Addendum Note (Signed)
Addended by: Gregery Na on: 09/09/2020 03:16 PM   Modules accepted: Orders

## 2020-09-11 ENCOUNTER — Other Ambulatory Visit: Payer: Self-pay | Admitting: Internal Medicine

## 2020-09-13 DIAGNOSIS — D1801 Hemangioma of skin and subcutaneous tissue: Secondary | ICD-10-CM | POA: Diagnosis not present

## 2020-09-13 DIAGNOSIS — D225 Melanocytic nevi of trunk: Secondary | ICD-10-CM | POA: Diagnosis not present

## 2020-09-13 DIAGNOSIS — D485 Neoplasm of uncertain behavior of skin: Secondary | ICD-10-CM | POA: Diagnosis not present

## 2020-09-13 DIAGNOSIS — D2261 Melanocytic nevi of right upper limb, including shoulder: Secondary | ICD-10-CM | POA: Diagnosis not present

## 2020-09-13 DIAGNOSIS — L814 Other melanin hyperpigmentation: Secondary | ICD-10-CM | POA: Diagnosis not present

## 2020-09-13 DIAGNOSIS — C44519 Basal cell carcinoma of skin of other part of trunk: Secondary | ICD-10-CM | POA: Diagnosis not present

## 2020-09-14 MED ORDER — LEVOTHYROXINE SODIUM 75 MCG PO TABS: 75.0000 ug | ORAL_TABLET | Freq: Every day | ORAL | 0 refills | Status: DC

## 2020-09-14 NOTE — Addendum Note (Signed)
Addended by: Gregery Na on: 09/14/2020 10:34 AM   Modules accepted: Orders

## 2020-09-16 ENCOUNTER — Other Ambulatory Visit: Payer: BC Managed Care – PPO | Admitting: Internal Medicine

## 2020-09-24 ENCOUNTER — Telehealth: Payer: Self-pay | Admitting: Internal Medicine

## 2020-09-24 NOTE — Telephone Encounter (Signed)
Patient was called about need for lab work with upcoming appt. She did not come for labs recently as scheduled because she ate breakfast day of fasting lab appt. She agrees to come Monday Fasting at 9:15 am  MJB,MD

## 2020-09-27 ENCOUNTER — Other Ambulatory Visit: Payer: Self-pay

## 2020-09-27 ENCOUNTER — Other Ambulatory Visit: Payer: BC Managed Care – PPO | Admitting: Internal Medicine

## 2020-09-27 DIAGNOSIS — E039 Hypothyroidism, unspecified: Secondary | ICD-10-CM | POA: Diagnosis not present

## 2020-09-28 ENCOUNTER — Ambulatory Visit (INDEPENDENT_AMBULATORY_CARE_PROVIDER_SITE_OTHER): Payer: BC Managed Care – PPO | Admitting: Internal Medicine

## 2020-09-28 ENCOUNTER — Encounter: Payer: Self-pay | Admitting: Internal Medicine

## 2020-09-28 VITALS — BP 110/80 | HR 60 | Wt 146.0 lb

## 2020-09-28 DIAGNOSIS — Z23 Encounter for immunization: Secondary | ICD-10-CM | POA: Diagnosis not present

## 2020-09-28 DIAGNOSIS — E039 Hypothyroidism, unspecified: Secondary | ICD-10-CM

## 2020-09-28 DIAGNOSIS — Z8639 Personal history of other endocrine, nutritional and metabolic disease: Secondary | ICD-10-CM

## 2020-09-28 LAB — TSH: TSH: 3.35 mIU/L (ref 0.40–4.50)

## 2020-09-28 NOTE — Progress Notes (Signed)
   Subjective:    Patient ID: Tracey Branch, female    DOB: 03/24/1961, 59 y.o.   MRN: [id number]  HPI  59 year old Female with history of hypothyroidism, longstanding having been maintained on levothyroxine 0.075 mg daily for a number of years.  Patient has been on Optiva diet and has lost some 50 pounds.  Has been out of thyroid replacement medication for several months.  At last visit here in June 2020 she weighed 189 pounds and now weighs 146 pounds for a weight loss of 43 pounds.  She feels well and looks great.  Recent TSH done through this office off thyroid replacement medication is 3.35.  At this point, I am comfortable leaving her off thyroid replacement medication and rechecking this in 6 months.  She has not had a recent physical exam here or anywhere else with GYN etc. in a while and I think we should do one in 6 months.  She is agreeable to this.  Continues on Prozac 40 mg daily and Cymbalta 20 mg daily.  Has taken Celebrex in the past for rheumatoid arthritis.  We have not refilled Celebrex in some time.  No longer on DMARD therapy for rheumatoid arthritis.   Was seen at [facility] urgent care on [address] in December 2020 for an intractable headache.  Covid and flu tests were negative.  Review of Systems no new complaints-says arthritis has gotten better with weight loss as well.     Objective:   Physical Exam Blood pressure 110/80 pulse 60 pulse oximetry 96% weight 146 pounds BMI 22.20  No thyromegaly.  No cervical adenopathy.  No swollen joints.  She looks great.       Assessment & Plan:  Excellent weight loss with Optiva diet.  Rheumatoid arthritis-no longer on DMARD therapy  History of hypothyroidism-TSH falls within normal range and we will continue to monitor.  Recheck in 6 months at time of health maintenance exam.  Flu vaccine given.

## 2020-09-28 NOTE — Patient Instructions (Addendum)
TSH is normal. Book CPE for Spring 2022. Stay off of thyroid replacement med as your TSH is normal. Flu vaccine given.

## 2020-10-28 DIAGNOSIS — M069 Rheumatoid arthritis, unspecified: Secondary | ICD-10-CM | POA: Diagnosis not present

## 2020-11-03 ENCOUNTER — Other Ambulatory Visit: Payer: Self-pay | Admitting: Internal Medicine

## 2020-11-05 ENCOUNTER — Other Ambulatory Visit: Payer: Self-pay | Admitting: Internal Medicine

## 2020-11-10 DIAGNOSIS — Z1159 Encounter for screening for other viral diseases: Secondary | ICD-10-CM | POA: Diagnosis not present

## 2020-11-24 DIAGNOSIS — M069 Rheumatoid arthritis, unspecified: Secondary | ICD-10-CM | POA: Diagnosis not present

## 2020-11-24 DIAGNOSIS — M797 Fibromyalgia: Secondary | ICD-10-CM | POA: Diagnosis not present

## 2020-11-24 DIAGNOSIS — M67911 Unspecified disorder of synovium and tendon, right shoulder: Secondary | ICD-10-CM | POA: Diagnosis not present

## 2020-11-24 DIAGNOSIS — M2550 Pain in unspecified joint: Secondary | ICD-10-CM | POA: Diagnosis not present

## 2020-12-13 ENCOUNTER — Other Ambulatory Visit: Payer: Self-pay | Admitting: Internal Medicine

## 2020-12-16 DIAGNOSIS — Z1159 Encounter for screening for other viral diseases: Secondary | ICD-10-CM | POA: Diagnosis not present

## 2020-12-23 DIAGNOSIS — M069 Rheumatoid arthritis, unspecified: Secondary | ICD-10-CM | POA: Diagnosis not present

## 2021-01-10 ENCOUNTER — Other Ambulatory Visit: Payer: Self-pay | Admitting: Internal Medicine

## 2021-01-17 ENCOUNTER — Other Ambulatory Visit: Payer: Self-pay | Admitting: Internal Medicine

## 2021-01-20 DIAGNOSIS — Z1159 Encounter for screening for other viral diseases: Secondary | ICD-10-CM | POA: Diagnosis not present

## 2021-02-03 ENCOUNTER — Other Ambulatory Visit: Payer: Self-pay | Admitting: Internal Medicine

## 2021-03-01 DIAGNOSIS — M069 Rheumatoid arthritis, unspecified: Secondary | ICD-10-CM | POA: Diagnosis not present

## 2021-03-14 ENCOUNTER — Other Ambulatory Visit: Payer: Self-pay | Admitting: Internal Medicine

## 2021-04-05 ENCOUNTER — Other Ambulatory Visit: Payer: BC Managed Care – PPO | Admitting: Internal Medicine

## 2021-04-07 ENCOUNTER — Encounter: Payer: BC Managed Care – PPO | Admitting: Internal Medicine

## 2021-04-14 IMAGING — CR CHEST - 2 VIEW
2 series · 2 of 2 positions shown · non-contrast
Comparison: None.

CLINICAL DATA: Nonproductive cough for 1 week.  Chest pain.

EXAM:
CHEST - 2 VIEW

[w chest pa]
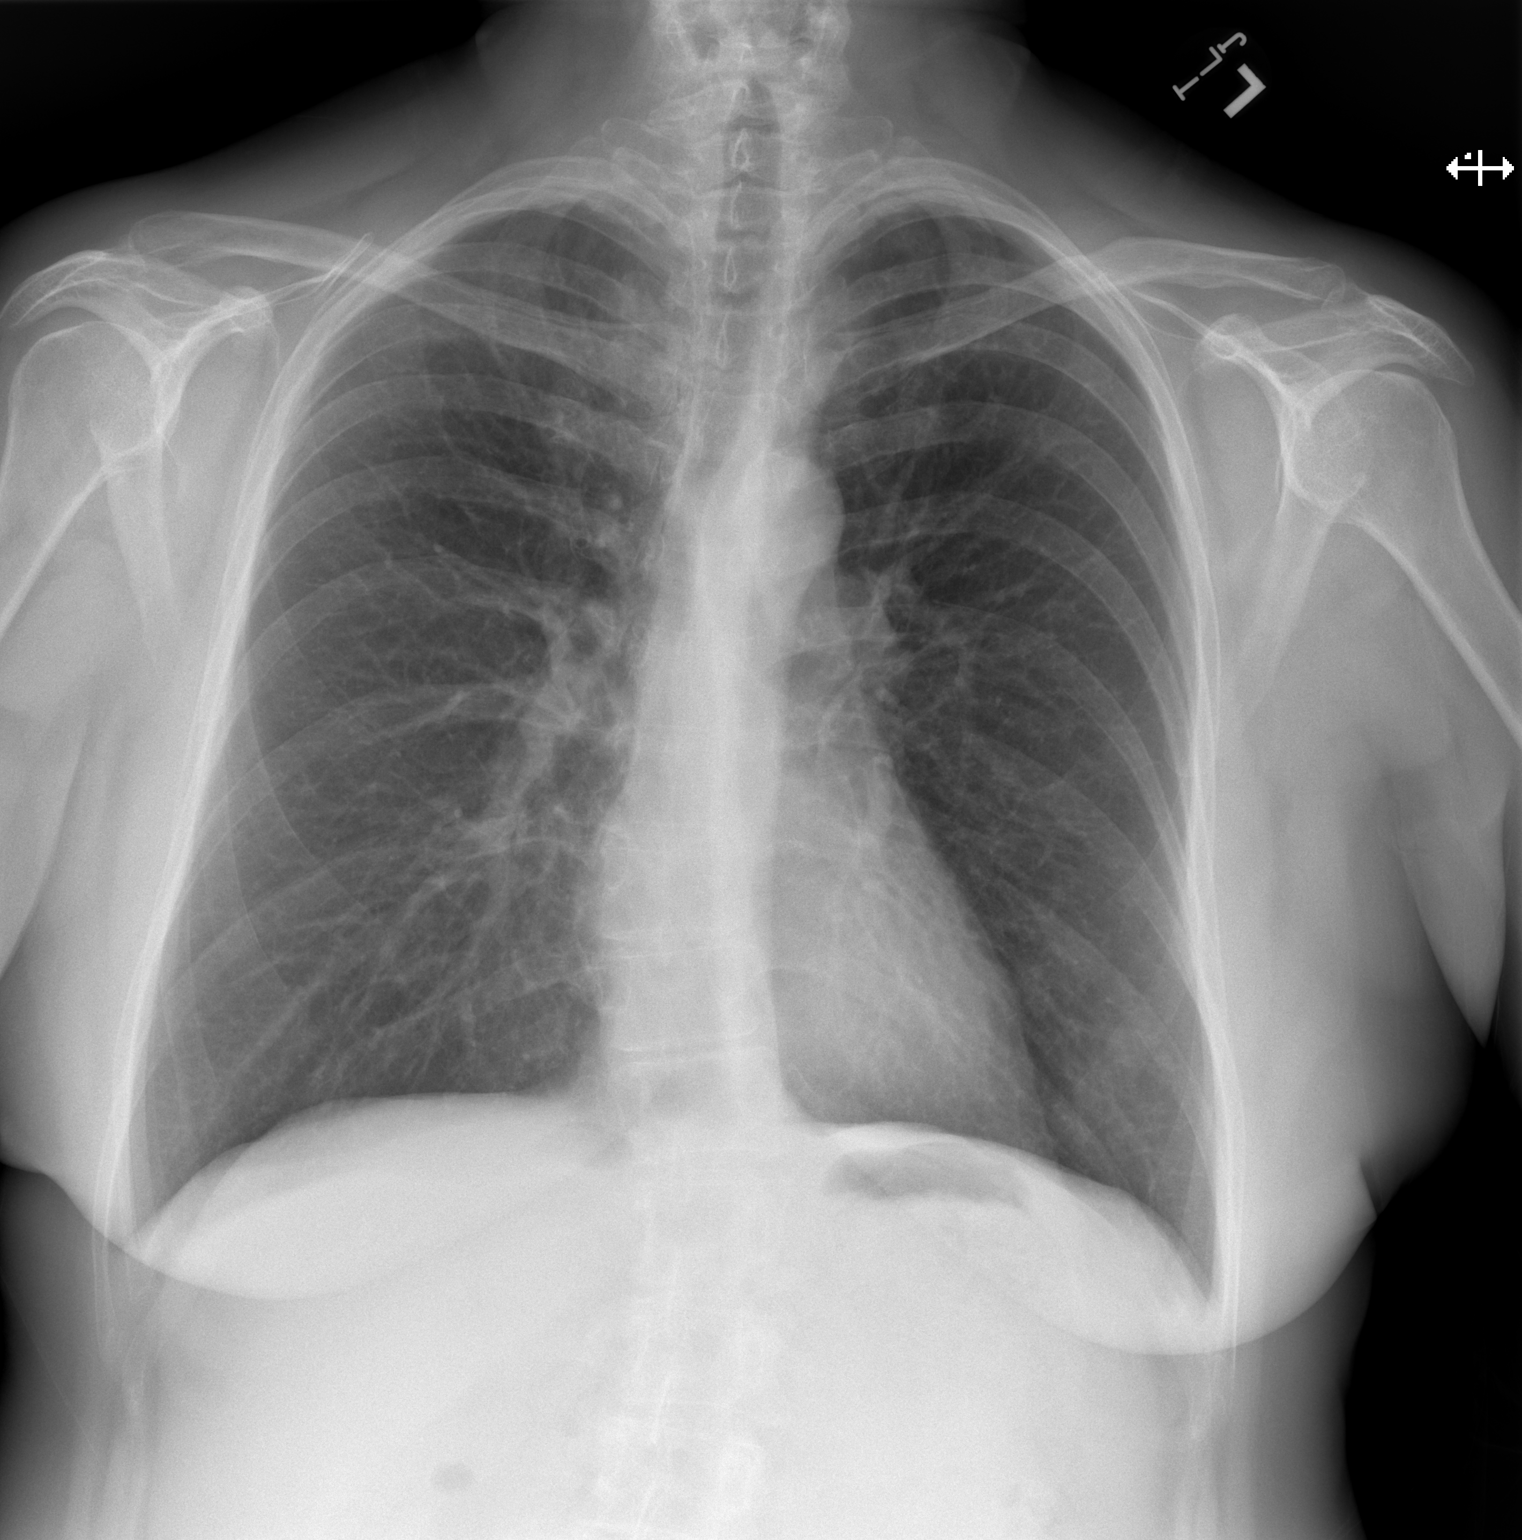

[w chest lat]
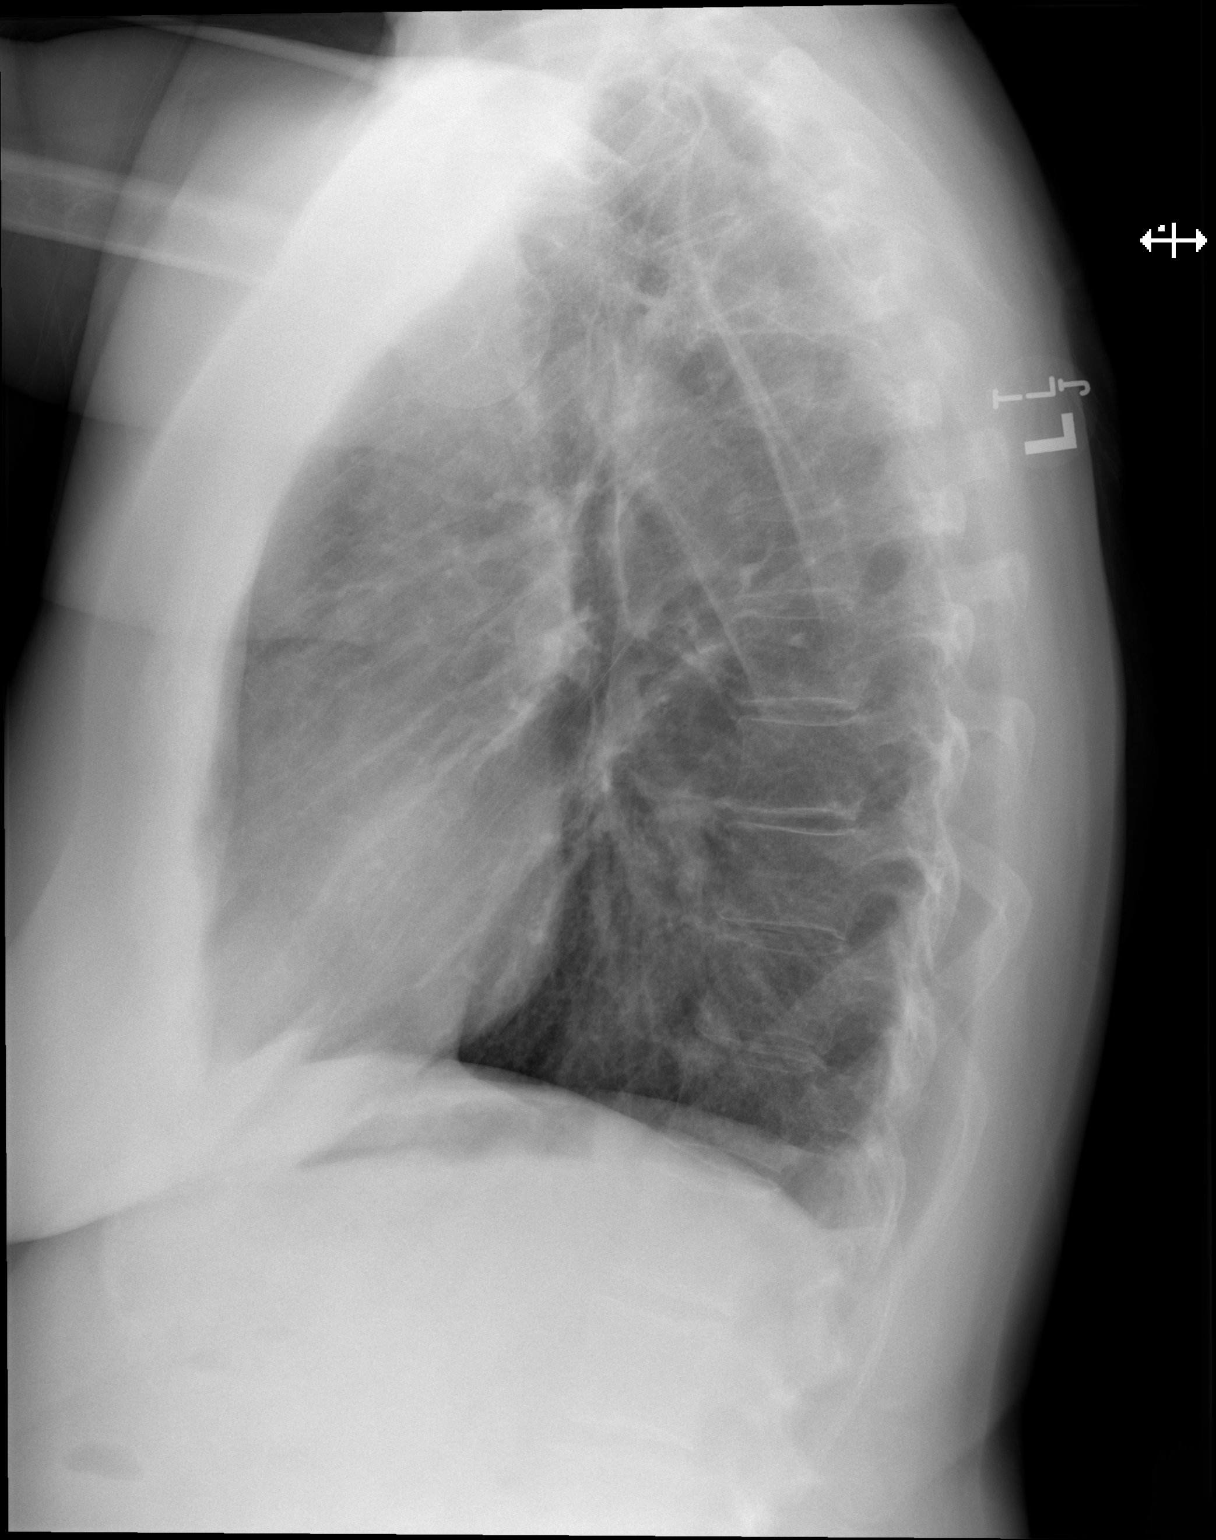

[2 of 2 positions shown; findings below may reference images not displayed]

FINDINGS: The heart size and mediastinal contours are within normal limits.
Both lungs are clear. The visualized skeletal structures are
unremarkable.
IMPRESSION: No active cardiopulmonary disease.

## 2021-04-26 DIAGNOSIS — M069 Rheumatoid arthritis, unspecified: Secondary | ICD-10-CM | POA: Diagnosis not present

## 2021-05-10 ENCOUNTER — Encounter: Payer: Self-pay | Admitting: Internal Medicine

## 2021-05-10 ENCOUNTER — Other Ambulatory Visit: Payer: Self-pay

## 2021-05-10 ENCOUNTER — Telehealth: Payer: Self-pay | Admitting: Internal Medicine

## 2021-05-10 ENCOUNTER — Telehealth (INDEPENDENT_AMBULATORY_CARE_PROVIDER_SITE_OTHER): Payer: BC Managed Care – PPO | Admitting: Internal Medicine

## 2021-05-10 DIAGNOSIS — Z8739 Personal history of other diseases of the musculoskeletal system and connective tissue: Secondary | ICD-10-CM | POA: Diagnosis not present

## 2021-05-10 DIAGNOSIS — Z79899 Other long term (current) drug therapy: Secondary | ICD-10-CM | POA: Diagnosis not present

## 2021-05-10 DIAGNOSIS — Z79620 Long term (current) use of immunosuppressive biologic: Secondary | ICD-10-CM

## 2021-05-10 DIAGNOSIS — U071 COVID-19: Secondary | ICD-10-CM | POA: Diagnosis not present

## 2021-05-10 NOTE — Telephone Encounter (Addendum)
Tracey Branch [phone]  [name] called to say she started getting sick on Tuesday of last week with Sore Throat, congestion, Wednesday she had fatigued, and on Thursday she started with body aches, coughing, diarrhea and dizzy, she also tested positive for COVID. Taking Musinx for cough and decongestant plus tylenol and Advil. Drinking plenty of power aid

## 2021-05-10 NOTE — Progress Notes (Signed)
   Subjective:    Patient ID: Tracey Branch, female    DOB: December 22, 1960, 60 y.o.   MRN: [id number]  HPI Pleasant 60 year old Female called today regarding acute COVID-19 virus infection.  She has a history of rheumatoid arthritis treated with DMARDs by Dr. Nickola Major.  Rheumatoid arthritis was diagnosed in 2015 while living in [place].  Currently not on methotrexate according to records from [facility] rheumatology.  Tried Humira but that lost efficacy after 3 months.  Tried Cimzia but that lost efficacy after 18 months.  She was started on Remicade in March 2020.  Methotrexate caused hair loss and fatigue.  In March was given a 12-day course of prednisone 5 mg daily.  Also takes Prozac, Celebrex, Cymbalta, thyroid replacement medication and omeprazole.  Symptoms started on Tuesday, May 24.  She has had sore throat, nasal congestion, fatigue.  On Thursday, May 26 she began to have myalgias, coughing, diarrhea, dizziness.  She tested positive for COVID-19 by home test at that time.  Has been taking Mucinex for cough, Tylenol and Advil.  He has been trying to stay well-hydrated.  Due to the Coronavirus pandemic she is seen today via interactive audio and video telecommunications.  She is identified using 2 identifiers as Tracey Branch, a patient in this practice.  She is at her home and I am at my office.  She is agreeable to visit in this format today.  She is seen in bed at her home.  She looks pale and lethargic.  Not heard to be coughing.  Reports her pulse oximetry is within normal limits.  Apparently husband is also ill with COVID-19.  Due to history of rheumatoid arthritis and current therapies, I feel she is a candidate for Paxil a bit and prescription will be sent via fax to her pharmacy.  She is to continue to quarantine at home for 5 to 7 days from onset of illness or until feeling better.  She will call if symptoms worsen.  She will continue to monitor pulse oximetry and call if it drops  below 95%.  She needs to walk around some to help keep lungs open.    Review of Systems see above is having some diarrhea     Objective:   Physical Exam  Vital signs reviewed.  She looks fatigued and pale.  She does not appear to be tachypneic      Assessment & Plan:  Acute COVID-19 virus infection  Rheumatoid arthritis treated with DMARDs  Reports that recent kidney functions/GFR through [facility] are normal.  This was confirmed.  She will take Paxlovid (Nirmatrelvir 20 x 150 mg 2 tablets twice daily and ritonavir 10 x 100 mg 1 tablet twice daily.  Request follow-up call in a couple of days for status report.

## 2021-05-10 NOTE — Telephone Encounter (Signed)
Virtual visit 

## 2021-05-10 NOTE — Patient Instructions (Signed)
Paxlovid prescribed to take exactly as directed.  Rest and drink fluids and monitor pulse oximetry.  Try to walk some.  Quarantine at least 5 to 7 days or until symptoms improved.  Call if symptoms worsen.

## 2021-05-10 NOTE — Telephone Encounter (Signed)
LVM to CB ASAP 

## 2021-05-10 NOTE — Telephone Encounter (Signed)
Schedule Virtual visit

## 2021-05-12 ENCOUNTER — Telehealth: Payer: Self-pay | Admitting: Internal Medicine

## 2021-05-12 NOTE — Telephone Encounter (Signed)
Faxed Positive Covid Home Results to [facility] [phone], positive as of 05/02/2021

## 2021-05-19 NOTE — Telephone Encounter (Signed)
Tracey Branch called today as a follow up, she has finished medication, she still has productive cough that is not producing anything, headache and unrelenting fatigue. She said she has been unable to even work half days.

## 2021-05-20 ENCOUNTER — Other Ambulatory Visit: Payer: BC Managed Care – PPO | Admitting: Internal Medicine

## 2021-05-20 ENCOUNTER — Ambulatory Visit
Admission: RE | Admit: 2021-05-20 | Discharge: 2021-05-20 | Disposition: A | Discharge status: Home / Self Care | Payer: BC Managed Care – PPO | Source: Ambulatory Visit | Attending: Internal Medicine | Admitting: Internal Medicine

## 2021-05-20 ENCOUNTER — Other Ambulatory Visit: Payer: Self-pay

## 2021-05-20 DIAGNOSIS — U071 COVID-19: Secondary | ICD-10-CM

## 2021-05-20 DIAGNOSIS — R059 Cough, unspecified: Secondary | ICD-10-CM | POA: Diagnosis not present

## 2021-05-20 DIAGNOSIS — Z8616 Personal history of COVID-19: Secondary | ICD-10-CM | POA: Diagnosis not present

## 2021-05-20 NOTE — Addendum Note (Signed)
Addended by: Gregery Na on: 05/20/2021 09:47 AM   Modules accepted: Orders

## 2021-05-20 NOTE — Telephone Encounter (Signed)
Patient was here for labs today, she went for her STAT chest xray. She can not make it Monday or Tuesday she scheduled to be seen on Thursday. She was advised to go the ER is she gets worse on the weekend.

## 2021-05-21 LAB — CBC WITH DIFFERENTIAL/PLATELET
Absolute Monocytes: 640 cells/uL (ref 200–950)
Basophils Absolute: 21 cells/uL (ref 0–200)
Basophils Relative: 0.4 %
Eosinophils Absolute: 52 cells/uL (ref 15–500)
Eosinophils Relative: 1 %
HCT: 42.7 % (ref 35.0–45.0)
Hemoglobin: 14.3 g/dL (ref 11.7–15.5)
Lymphs Abs: 2163 cells/uL (ref 850–3900)
MCH: 30.8 pg (ref 27.0–33.0)
MCHC: 33.5 g/dL (ref 32.0–36.0)
MCV: 92 fL (ref 80.0–100.0)
MPV: 10.8 fL (ref 7.5–12.5)
Monocytes Relative: 12.3 %
Neutro Abs: 2324 cells/uL (ref 1500–7800)
Neutrophils Relative %: 44.7 %
Platelets: 255 10*3/uL (ref 140–400)
RBC: 4.64 10*6/uL (ref 3.80–5.10)
RDW: 11.9 % (ref 11.0–15.0)
Total Lymphocyte: 41.6 %
WBC: 5.2 10*3/uL (ref 3.8–10.8)

## 2021-05-21 LAB — COMPLETE METABOLIC PANEL WITH GFR
AG Ratio: 1.9 (calc) (ref 1.0–2.5)
ALT: 24 U/L (ref 6–29)
AST: 24 U/L (ref 10–35)
Albumin: 4.1 g/dL (ref 3.6–5.1)
Alkaline phosphatase (APISO): 43 U/L (ref 37–153)
BUN: 19 mg/dL (ref 7–25)
CO2: 29 mmol/L (ref 20–32)
Calcium: 9.2 mg/dL (ref 8.6–10.4)
Chloride: 104 mmol/L (ref 98–110)
Creat: 0.74 mg/dL (ref 0.50–1.05)
GFR, Est African American: 103 mL/min/{1.73_m2} (ref >=60)
GFR, Est Non African American: 89 mL/min/{1.73_m2} (ref >=60)
Globulin: 2.2 g/dL (calc) (ref 1.9–3.7)
Glucose, Bld: 103 mg/dL (ref 65–139)
Potassium: 5 mmol/L (ref 3.5–5.3)
Sodium: 140 mmol/L (ref 135–146)
Total Bilirubin: 0.5 mg/dL (ref 0.2–1.2)
Total Protein: 6.3 g/dL (ref 6.1–8.1)

## 2021-05-21 LAB — TSH: TSH: 3.13 mIU/L (ref 0.40–4.50)

## 2021-05-26 ENCOUNTER — Ambulatory Visit: Payer: BC Managed Care – PPO | Admitting: Internal Medicine

## 2021-06-16 ENCOUNTER — Other Ambulatory Visit: Payer: BC Managed Care – PPO | Admitting: Internal Medicine

## 2021-06-17 ENCOUNTER — Other Ambulatory Visit: Payer: BC Managed Care – PPO | Admitting: Internal Medicine

## 2021-06-17 ENCOUNTER — Other Ambulatory Visit: Payer: Self-pay

## 2021-06-17 ENCOUNTER — Ambulatory Visit (INDEPENDENT_AMBULATORY_CARE_PROVIDER_SITE_OTHER): Payer: BC Managed Care – PPO | Admitting: Internal Medicine

## 2021-06-17 ENCOUNTER — Encounter: Payer: Self-pay | Admitting: Internal Medicine

## 2021-06-17 VITALS — BP 102/70 | HR 66 | Ht 68.0 in | Wt 156.0 lb

## 2021-06-17 DIAGNOSIS — Z23 Encounter for immunization: Secondary | ICD-10-CM

## 2021-06-17 DIAGNOSIS — E039 Hypothyroidism, unspecified: Secondary | ICD-10-CM | POA: Diagnosis not present

## 2021-06-17 DIAGNOSIS — Z0000 Encounter for general adult medical examination without abnormal findings: Secondary | ICD-10-CM | POA: Diagnosis not present

## 2021-06-17 DIAGNOSIS — E7800 Pure hypercholesterolemia, unspecified: Secondary | ICD-10-CM

## 2021-06-17 DIAGNOSIS — M069 Rheumatoid arthritis, unspecified: Secondary | ICD-10-CM | POA: Diagnosis not present

## 2021-06-17 DIAGNOSIS — Z8739 Personal history of other diseases of the musculoskeletal system and connective tissue: Secondary | ICD-10-CM | POA: Diagnosis not present

## 2021-06-17 DIAGNOSIS — E559 Vitamin D deficiency, unspecified: Secondary | ICD-10-CM | POA: Diagnosis not present

## 2021-06-17 DIAGNOSIS — Z8639 Personal history of other endocrine, nutritional and metabolic disease: Secondary | ICD-10-CM

## 2021-06-17 LAB — POCT URINALYSIS DIPSTICK
Appearance: NEGATIVE
Bilirubin, UA: NEGATIVE
Blood, UA: NEGATIVE
Glucose, UA: NEGATIVE
Ketones, UA: NEGATIVE
Leukocytes, UA: NEGATIVE
Nitrite, UA: NEGATIVE
Odor: NEGATIVE
Protein, UA: NEGATIVE
Spec Grav, UA: 1.01 (ref 1.010–1.025)
Urobilinogen, UA: 0.2 E.U./dL
pH, UA: 6.5 (ref 5.0–8.0)

## 2021-06-17 NOTE — Progress Notes (Signed)
Subjective:    Patient ID: Tracey Branch, female    DOB: 05-01-1961, 60 y.o.   MRN: [id number]  HPI  60 year old Female for health maintenance exam and evaluation of medical issues.  Patient had acute COVID-19 in late May and was seen via virtual visit.  Symptoms started on Tuesday, May 24 with sore throat nasal congestion and fatigue.  On Thursday, May 26, she had myalgias coughing diarrhea and dizziness.  She tested positive for COVID-19 by home test at that time.  Husband also tested positive for COVID-19 at the same time.  She has a history of rheumatoid arthritis treated with DMARDs by Dr. Nickola Major.  Rheumatoid arthritis was diagnosed in 2015 while living in [place].  Tried Humira but that lost efficacy after 3 months.  Started on Remicade in March 2020.  Methotrexate caused hair loss and fatigue.  History of hypothyroidism and GE reflux, depression and musculoskeletal pain.  She had success with Optiva diet and lost about 50 pounds.  No longer on DMARD therapy for rheumatoid arthritis.  Has taken Celebrex in the past.  SSRI helps pain and mild depression.  Patient is reported that rheumatoid arthritis is seronegative.  Also has history of fibromyalgia syndrome.  Was diagnosed in 2015 when living in [place] with rheumatoid arthritis presenting his fatigue bilateral foot and hand pain which seem to be triggered following her mother's death.  Has tried Harriette Ohara, methotrexate, Cymbalta and Celebrex.  History of right ACL repair 2006.  History of right meniscal tear 2006.  Became menopausal 2012.  She has 2 adopted daughters from [place] that are both going to be in college in [place] this Fall.  2 sons in their early 21s.  1 son recently moved to [place] and 1 son is traveling in [place] currently.  Social history: Husband is: Still here Education officer, museum at [employer] in [place].  They moved here from [place] and formerly lived in [place].  She is Orthoptist at [employer].  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       Review of Systems-see above it took some time to recover from COVID but she is feeling better     Objective:   Physical Exam Blood pressure 102/70 pulse 66 pulse oximetry 97% weight 156 pounds height 5 feet 8 inches BMI 23.72 Skin: Warm and dry.  No cervical adenopathy.  No thyromegaly.  No carotid bruits.  Chest is clear to auscultation without rales or wheezing.  Cardiac exam: Regular rate and rhythm normal S1 and S2 without murmurs.  Abdomen soft nondistended without hepatosplenomegaly masses or tenderness.  Pelvic exam deferred to GYN physician.  No lower extremity pitting edema.  No red or swollen joints to my exam today.  Affect thought and judgment are normal.       Assessment & Plan:  Normal health maintenance exam.  Tetanus immunization update given today.  History of rheumatoid arthritis currently not on DMARD and stable.  Currently taking SSRI and Celebrex  History of hyperlipidemia treated with Crestor 5 mg daily and lab work is entirely normal.  Lipids have improved from 2 32-1 65 and LDL cholesterol has improved from 1 52-81.  HDL is actually increased from 59-71 and triglycerides have decreased from 100-46.  History of hypothyroidism.  TSH stable on current dose of thyroid replacement medication  History of COVID-19 recently and has recovered.  Has had 3 COVID vaccines the last 1 in August 2021.  In couple of  months needs booster.  History of mild depression treated with SSRI  Health maintenance: Given her options for GYN care.  Mammogram ordered.  Needs to have Pap smear through GYN.  Needs to consider bone density study.  Colonoscopy recommended.  Return in 1 year or as needed.  She will see GYN physician regarding Pap smear and pelvic exam.

## 2021-06-17 NOTE — Patient Instructions (Addendum)
Tetanus update given. Mammogram ordered. Labs drawn and pending. Colonoscopy recommended. RTC in one year or as needed.

## 2021-06-18 LAB — COMPLETE METABOLIC PANEL WITH GFR
AG Ratio: 2.1 (calc) (ref 1.0–2.5)
ALT: 23 U/L (ref 6–29)
AST: 24 U/L (ref 10–35)
Albumin: 4.1 g/dL (ref 3.6–5.1)
Alkaline phosphatase (APISO): 43 U/L (ref 37–153)
BUN: 18 mg/dL (ref 7–25)
CO2: 30 mmol/L (ref 20–32)
Calcium: 8.9 mg/dL (ref 8.6–10.4)
Chloride: 105 mmol/L (ref 98–110)
Creat: 0.71 mg/dL (ref 0.50–1.05)
GFR, Est African American: 109 mL/min/{1.73_m2} (ref >=60)
GFR, Est Non African American: 94 mL/min/{1.73_m2} (ref >=60)
Globulin: 2 g/dL (calc) (ref 1.9–3.7)
Glucose, Bld: 83 mg/dL (ref 65–99)
Potassium: 4.9 mmol/L (ref 3.5–5.3)
Sodium: 141 mmol/L (ref 135–146)
Total Bilirubin: 0.4 mg/dL (ref 0.2–1.2)
Total Protein: 6.1 g/dL (ref 6.1–8.1)

## 2021-06-18 LAB — CBC WITH DIFFERENTIAL/PLATELET
Absolute Monocytes: 524 cells/uL (ref 200–950)
Basophils Absolute: 22 cells/uL (ref 0–200)
Basophils Relative: 0.5 %
Eosinophils Absolute: 70 cells/uL (ref 15–500)
Eosinophils Relative: 1.6 %
HCT: 41.3 % (ref 35.0–45.0)
Hemoglobin: 13.7 g/dL (ref 11.7–15.5)
Lymphs Abs: 1861 cells/uL (ref 850–3900)
MCH: 30.8 pg (ref 27.0–33.0)
MCHC: 33.2 g/dL (ref 32.0–36.0)
MCV: 92.8 fL (ref 80.0–100.0)
MPV: 10.9 fL (ref 7.5–12.5)
Monocytes Relative: 11.9 %
Neutro Abs: 1923 cells/uL (ref 1500–7800)
Neutrophils Relative %: 43.7 %
Platelets: 234 10*3/uL (ref 140–400)
RBC: 4.45 10*6/uL (ref 3.80–5.10)
RDW: 12 % (ref 11.0–15.0)
Total Lymphocyte: 42.3 %
WBC: 4.4 10*3/uL (ref 3.8–10.8)

## 2021-06-18 LAB — LIPID PANEL
Cholesterol: 165 mg/dL (ref <200)
HDL: 71 mg/dL (ref >=50)
LDL Cholesterol (Calc): 81 mg/dL (calc)
Non-HDL Cholesterol (Calc): 94 mg/dL (calc) (ref <130)
Total CHOL/HDL Ratio: 2.3 (calc) (ref <5.0)
Triglycerides: 46 mg/dL (ref <150)

## 2021-06-18 LAB — VITAMIN D 25 HYDROXY (VIT D DEFICIENCY, FRACTURES): Vit D, 25-Hydroxy: 58 ng/mL (ref 30–100)

## 2021-06-18 LAB — TSH: TSH: 3.6 mIU/L (ref 0.40–4.50)

## 2021-06-21 DIAGNOSIS — M069 Rheumatoid arthritis, unspecified: Secondary | ICD-10-CM | POA: Diagnosis not present

## 2021-07-18 ENCOUNTER — Other Ambulatory Visit: Payer: Self-pay | Admitting: Internal Medicine

## 2021-07-20 DIAGNOSIS — Z79899 Other long term (current) drug therapy: Secondary | ICD-10-CM | POA: Diagnosis not present

## 2021-07-20 DIAGNOSIS — M2550 Pain in unspecified joint: Secondary | ICD-10-CM | POA: Diagnosis not present

## 2021-07-20 DIAGNOSIS — M797 Fibromyalgia: Secondary | ICD-10-CM | POA: Diagnosis not present

## 2021-07-20 DIAGNOSIS — M0600 Rheumatoid arthritis without rheumatoid factor, unspecified site: Secondary | ICD-10-CM | POA: Diagnosis not present

## 2021-08-23 DIAGNOSIS — Z111 Encounter for screening for respiratory tuberculosis: Secondary | ICD-10-CM | POA: Diagnosis not present

## 2021-08-23 DIAGNOSIS — M069 Rheumatoid arthritis, unspecified: Secondary | ICD-10-CM | POA: Diagnosis not present

## 2021-08-23 DIAGNOSIS — R5383 Other fatigue: Secondary | ICD-10-CM | POA: Diagnosis not present

## 2021-08-23 DIAGNOSIS — Z79899 Other long term (current) drug therapy: Secondary | ICD-10-CM | POA: Diagnosis not present

## 2021-09-15 DIAGNOSIS — L821 Other seborrheic keratosis: Secondary | ICD-10-CM | POA: Diagnosis not present

## 2021-09-15 DIAGNOSIS — Z85828 Personal history of other malignant neoplasm of skin: Secondary | ICD-10-CM | POA: Diagnosis not present

## 2021-09-15 DIAGNOSIS — L814 Other melanin hyperpigmentation: Secondary | ICD-10-CM | POA: Diagnosis not present

## 2021-09-15 DIAGNOSIS — D1801 Hemangioma of skin and subcutaneous tissue: Secondary | ICD-10-CM | POA: Diagnosis not present

## 2021-10-17 DIAGNOSIS — M069 Rheumatoid arthritis, unspecified: Secondary | ICD-10-CM | POA: Diagnosis not present

## 2021-10-17 DIAGNOSIS — Z79899 Other long term (current) drug therapy: Secondary | ICD-10-CM | POA: Diagnosis not present

## 2021-11-28 DIAGNOSIS — R7989 Other specified abnormal findings of blood chemistry: Secondary | ICD-10-CM | POA: Diagnosis not present

## 2021-11-28 DIAGNOSIS — M069 Rheumatoid arthritis, unspecified: Secondary | ICD-10-CM | POA: Diagnosis not present

## 2021-12-07 ENCOUNTER — Other Ambulatory Visit: Payer: Self-pay | Admitting: Rheumatology

## 2021-12-07 DIAGNOSIS — R7989 Other specified abnormal findings of blood chemistry: Secondary | ICD-10-CM

## 2021-12-23 ENCOUNTER — Other Ambulatory Visit: Payer: BC Managed Care – PPO

## 2022-01-05 ENCOUNTER — Other Ambulatory Visit: Payer: Self-pay | Admitting: Internal Medicine

## 2022-01-10 ENCOUNTER — Ambulatory Visit
Admission: RE | Admit: 2022-01-10 | Discharge: 2022-01-10 | Disposition: A | Discharge status: Home / Self Care | Payer: BC Managed Care – PPO | Source: Ambulatory Visit | Attending: Rheumatology | Admitting: Rheumatology

## 2022-01-10 DIAGNOSIS — R7989 Other specified abnormal findings of blood chemistry: Secondary | ICD-10-CM | POA: Diagnosis not present

## 2022-01-10 DIAGNOSIS — K760 Fatty (change of) liver, not elsewhere classified: Secondary | ICD-10-CM | POA: Diagnosis not present

## 2022-01-11 DIAGNOSIS — M150 Primary generalized (osteo)arthritis: Secondary | ICD-10-CM | POA: Diagnosis not present

## 2022-01-11 DIAGNOSIS — M2550 Pain in unspecified joint: Secondary | ICD-10-CM | POA: Diagnosis not present

## 2022-01-11 DIAGNOSIS — M797 Fibromyalgia: Secondary | ICD-10-CM | POA: Diagnosis not present

## 2022-01-11 DIAGNOSIS — M0600 Rheumatoid arthritis without rheumatoid factor, unspecified site: Secondary | ICD-10-CM | POA: Diagnosis not present

## 2022-01-17 DIAGNOSIS — M069 Rheumatoid arthritis, unspecified: Secondary | ICD-10-CM | POA: Diagnosis not present

## 2022-01-30 ENCOUNTER — Other Ambulatory Visit: Payer: Self-pay | Admitting: Internal Medicine

## 2022-02-28 DIAGNOSIS — M069 Rheumatoid arthritis, unspecified: Secondary | ICD-10-CM | POA: Diagnosis not present

## 2022-02-28 DIAGNOSIS — Z79899 Other long term (current) drug therapy: Secondary | ICD-10-CM | POA: Diagnosis not present

## 2022-04-11 DIAGNOSIS — M069 Rheumatoid arthritis, unspecified: Secondary | ICD-10-CM | POA: Diagnosis not present

## 2022-05-23 DIAGNOSIS — M069 Rheumatoid arthritis, unspecified: Secondary | ICD-10-CM | POA: Diagnosis not present

## 2022-05-23 DIAGNOSIS — Z79899 Other long term (current) drug therapy: Secondary | ICD-10-CM | POA: Diagnosis not present

## 2022-07-11 ENCOUNTER — Other Ambulatory Visit: Payer: Self-pay | Admitting: Internal Medicine

## 2022-09-06 ENCOUNTER — Other Ambulatory Visit: Payer: Self-pay | Admitting: Internal Medicine

## 2022-09-06 DIAGNOSIS — Z13820 Encounter for screening for osteoporosis: Secondary | ICD-10-CM

## 2022-09-06 DIAGNOSIS — Z1231 Encounter for screening mammogram for malignant neoplasm of breast: Secondary | ICD-10-CM

## 2023-01-18 ENCOUNTER — Ambulatory Visit (INDEPENDENT_AMBULATORY_CARE_PROVIDER_SITE_OTHER): Payer: Commercial Managed Care - PPO

## 2023-01-18 ENCOUNTER — Encounter (HOSPITAL_COMMUNITY): Payer: Self-pay

## 2023-01-18 ENCOUNTER — Ambulatory Visit (HOSPITAL_COMMUNITY)
Admission: EM | Admit: 2023-01-18 | Discharge: 2023-01-18 | Disposition: A | Discharge status: Home / Self Care | Payer: Commercial Managed Care - PPO | Attending: Internal Medicine | Admitting: Internal Medicine

## 2023-01-18 DIAGNOSIS — W19XXXA Unspecified fall, initial encounter: Secondary | ICD-10-CM

## 2023-01-18 DIAGNOSIS — M25532 Pain in left wrist: Secondary | ICD-10-CM

## 2023-01-18 DIAGNOSIS — S52502A Unspecified fracture of the lower end of left radius, initial encounter for closed fracture: Secondary | ICD-10-CM | POA: Diagnosis not present

## 2023-01-18 NOTE — ED Provider Notes (Signed)
[facility]    CSN: [id number] Arrival date & time: 01/18/23  1023      History   Chief Complaint Chief Complaint  Patient presents with   Fall   Wrist Injury    HPI Tracey Branch is a 62 y.o. female.   Patient presents to urgent care for evaluation of left dorsal wrist pain/injury post mechanical fall.  Patient was rushing to get to school to [place] a standardized test when she tripped over her feet on a door jam and ended up catching her balance on a brick wall with her left wrist. Pain to the left wrist is worse with extension of the left wrist than flexion. She denies numbness and tingling to the left hand, laceration/abrasion to the left wrist, difficulty moving the fingers of the left hand, and temperature change/color change to the left hand distal to the left wrist injury. She reports remote history of previous injury to the left wrist that healed on its own without surgical intervention. States the school nurse gave her some medicine this morning that she believes to be ibuprofen but she isn't sure at 8:30am.  Patient states she did not hit her head during the fall and was able to break her fall with her left wrist against the brick wall.   Fall  Wrist Injury   Past Medical History:  Diagnosis Date   Arthritis    Depression    Substance abuse ([place])     Patient Active Problem List   Diagnosis Date Noted   GE reflux 06/07/2018   Hypothyroidism, unspecified 03/02/2018   Hypercholesterolemia 11/09/2017   Rheumatoid arthritis ([place]) 09/05/2017   Fibromyalgia 09/05/2017    Past Surgical History:  Procedure Laterality Date   KNEE ARTHROSCOPY WITH ANTERIOR CRUCIATE LIGAMENT (ACL) REPAIR Right     OB History   No obstetric history on file.      Home Medications    Prior to Admission medications   Medication Sig Start Date End Date Taking? Authorizing Provider  celecoxib (CELEBREX) 200 MG capsule  10/10/17   [provider]  DULoxetine  (CYMBALTA) 20 MG capsule TAKE 1 CAPSULE DAILY 10/16/19   Elby Showers, MD  FLUoxetine (PROZAC) 40 MG capsule TAKE 1 CAPSULE DAILY 07/18/21   Elby Showers, MD  omeprazole (PRILOSEC) 20 MG capsule TAKE 1 CAPSULE DAILY 01/05/22   Elby Showers, MD  rosuvastatin (CRESTOR) 5 MG tablet TAKE 1 TABLET DAILY 01/30/22   Elby Showers, MD    Family History Family History  Problem Relation Age of Onset   Heart disease Father     Social History Social History   Tobacco Use   Smoking status: Never   Smokeless tobacco: Never  Vaping Use   Vaping Use: Never used  Substance Use Topics   Alcohol use: No   Drug use: No     Allergies   Iodine   Review of Systems Review of Systems Per HPI  Physical Exam Triage Vital Signs ED Triage Vitals [01/18/23 1140]  Enc Vitals Group     BP 129/81     Pulse Rate 73     Resp 16     Temp (!) 97.5 F (36.4 C)     Temp Source Oral     SpO2 96 %     Weight      Height      Head Circumference      Peak Flow      Pain Score 5  Pain Loc      Pain Edu?      Excl. in [place]?    No data found.  Updated Vital Signs BP 129/81 (BP Location: Right Arm)   Pulse 73   Temp (!) 97.5 F (36.4 C) (Oral)   Resp 16   SpO2 96%   Visual Acuity Right Eye Distance:   Left Eye Distance:   Bilateral Distance:    Right Eye Near:   Left Eye Near:    Bilateral Near:     Physical Exam Vitals and nursing note reviewed.  Constitutional:      Appearance: She is not ill-appearing or toxic-appearing.  HENT:     Head: Normocephalic and atraumatic.     Right Ear: Hearing and external ear normal.     Left Ear: Hearing and external ear normal.     Nose: Nose normal.     Mouth/Throat:     Lips: Pink.  Eyes:     General: Lids are normal. Vision grossly intact. Gaze aligned appropriately.     Extraocular Movements: Extraocular movements intact.     Conjunctiva/sclera: Conjunctivae normal.  Cardiovascular:     Rate and Rhythm: Normal rate and regular  rhythm.     Heart sounds: Normal heart sounds, S1 normal and S2 normal.  Pulmonary:     Effort: Pulmonary effort is normal. No respiratory distress.     Breath sounds: Normal breath sounds and air entry.  Musculoskeletal:     Right forearm: Normal.     Left forearm: Normal.     Right wrist: Normal.     Left wrist: Swelling and tenderness present. No deformity, effusion, lacerations, bony tenderness, snuff box tenderness or crepitus. Decreased range of motion. Normal pulse.     Right hand: Normal.     Left hand: Normal.     Cervical back: Neck supple.     Comments: Slight swelling to the dorsal aspect of the left wrist.  TTP to the generalized wrist.  +2 left radial pulse with less than 3 capillary refill to the left hand.  5/5 grip strength to the left hand with sensation intact distal to injury.  No obvious deformity to the left wrist.  No ecchymosis.  Skin:    General: Skin is warm and dry.     Capillary Refill: Capillary refill takes less than 2 seconds.     Findings: No rash.  Neurological:     General: No focal deficit present.     Mental Status: She is alert and oriented to person, place, and time. Mental status is at baseline.     Cranial Nerves: No dysarthria or facial asymmetry.  Psychiatric:        Mood and Affect: Mood normal.        Speech: Speech normal.        Behavior: Behavior normal.        Thought Content: Thought content normal.        Judgment: Judgment normal.      UC Treatments / Results  Labs (all labs ordered are listed, but only abnormal results are displayed) Labs Reviewed - No data to display  EKG   Radiology DG Wrist Complete Left  Result Date: 01/18/2023 CLINICAL DATA:  Fall. Tripped over a door jam catching weight on left wrist against a brick wall at school today. EXAM: LEFT WRIST - COMPLETE 3+ VIEW COMPARISON:  None Available. FINDINGS: Question of minimal cortical step-off about the dorsal radial aspect of the distal radius,  possible  impaction fracture. No other fracture of the wrist. No dislocation. 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 Occasional degenerative carpal bone cysts. Mild soft tissue edema. IMPRESSION: 1. Questionable impaction fracture of the distal radius. Recommend correlation with focal tenderness and consider follow-up radiographs in 7-10 days. 2. 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 Electronically Signed   By: Keith Rake M.D.   On: 01/18/2023 12:14    Procedures Procedures (including critical care time)  Medications Ordered in UC Medications - No data to display  Initial Impression / Assessment and Plan / UC Course  I have reviewed the triage vital signs and the nursing notes.  Pertinent labs & imaging results that were available during my care of the patient were reviewed by me and considered in my medical decision making (see chart for details).   1. Fall, closed fracture of distal end of left radius X-ray confirms likely impaction fracture to area of point tenderness on exam at the distal end of the left radius. Patient placed in pre-fabricated wrist splint as she is non tender to the anatomical snuff box. Must wear splint at all times and take off to shower. Advised to follow-up with orthopedic provider listed on paperwork in the next 3-5 days for ongoing follow-up evaluation and further management. May use ibuprofen 668m and/or tylenol 1,0021mevery 6 hours as needed for pain. RICE advised. Work note provided.   Discussed physical exam and available lab work findings in clinic with patient.  Counseled patient regarding appropriate use of medications and potential side effects for all medications recommended or prescribed today. Discussed red flag signs and symptoms of worsening condition,when to call the PCP office, return to urgent care, and when to seek higher level of care in the emergency department. Patient verbalizes understanding and agreement  with plan. All questions answered. Patient discharged in stable condition.    Final Clinical Impressions(s) / UC Diagnoses   Final diagnoses:  Fall, initial encounter  Closed fracture of distal end of left radius, unspecified fracture morphology, initial encounter     Discharge Instructions      You have been evaluated for wrist pain today. Your evaluation showed a likely fracture of your distal radius (wrist). We have placed your wrist in a splint today, avoid getting the splint wet.  Wear the splint all the time until your follow-up appointment with orthopedics.  When you shower, take the splint off and be very gentle with your wrist/hand.  Rest, ice, elevate, and compress the injury to reduce swelling and inflammation.   You may take Tylenol 1000 mg every 6 hours and/or ibuprofen 600 mg every 6 hours as needed for pain and inflammation.  Schedule an appointment with the orthopedic provider listed on your paperwork for follow-up in the next 3-5 days.  Return if you experience worsening pain, numbness, tingling, skin color changes, or any other concerning symptoms. If symptoms are severe, please go to the ER. I hope you feel better!!      ED Prescriptions   None    PDMP not reviewed this encounter.   StJoella Prince,[place]2/10/24 [phone]

## 2023-01-18 NOTE — Discharge Instructions (Addendum)
You have been evaluated for wrist pain today. Your evaluation showed a likely fracture of your distal radius (wrist). We have placed your wrist in a splint today, avoid getting the splint wet.  Wear the splint all the time until your follow-up appointment with orthopedics.  When you shower, take the splint off and be very gentle with your wrist/hand.  Rest, ice, elevate, and compress the injury to reduce swelling and inflammation.   You may take Tylenol 1000 mg every 6 hours and/or ibuprofen 600 mg every 6 hours as needed for pain and inflammation.  Schedule an appointment with the orthopedic provider listed on your paperwork for follow-up in the next 3-5 days.  Return if you experience worsening pain, numbness, tingling, skin color changes, or any other concerning symptoms. If symptoms are severe, please go to the ER. I hope you feel better!!

## 2023-01-18 NOTE — ED Triage Notes (Signed)
Patient reports that she tripped over a door jamb and caught her weight on her left wrist against a brick wall at school today.  Patient reports that she took Ibuprofen 400 mg at 0830 today.

## 2023-01-22 ENCOUNTER — Telehealth: Payer: Self-pay

## 2023-01-22 NOTE — Telephone Encounter (Deleted)
error 

## 2023-01-22 NOTE — Telephone Encounter (Signed)
Patient returned call and informed me that she is no longer a patient of Dr. Renold Genta.  No notification given prior.

## 2023-02-11 LAB — EXTERNAL GENERIC LAB PROCEDURE: COLOGUARD: NEGATIVE

## 2023-02-11 LAB — COLOGUARD: COLOGUARD: NEGATIVE

## 2023-03-07 ENCOUNTER — Ambulatory Visit
Admission: RE | Admit: 2023-03-07 | Discharge: 2023-03-07 | Disposition: A | Discharge status: Home / Self Care | Payer: Commercial Managed Care - PPO | Source: Ambulatory Visit | Attending: Internal Medicine | Admitting: Internal Medicine

## 2023-03-07 DIAGNOSIS — Z1231 Encounter for screening mammogram for malignant neoplasm of breast: Secondary | ICD-10-CM

## 2023-03-07 DIAGNOSIS — Z13820 Encounter for screening for osteoporosis: Secondary | ICD-10-CM

## 2023-03-26 IMAGING — CR DG CHEST 2V
2 series · 2 of 2 positions shown · non-contrast
Comparison: June 09, 2019

CLINICAL DATA: Cough.  Recent [id number] positive

EXAM:
CHEST - 2 VIEW

[w chest pa]
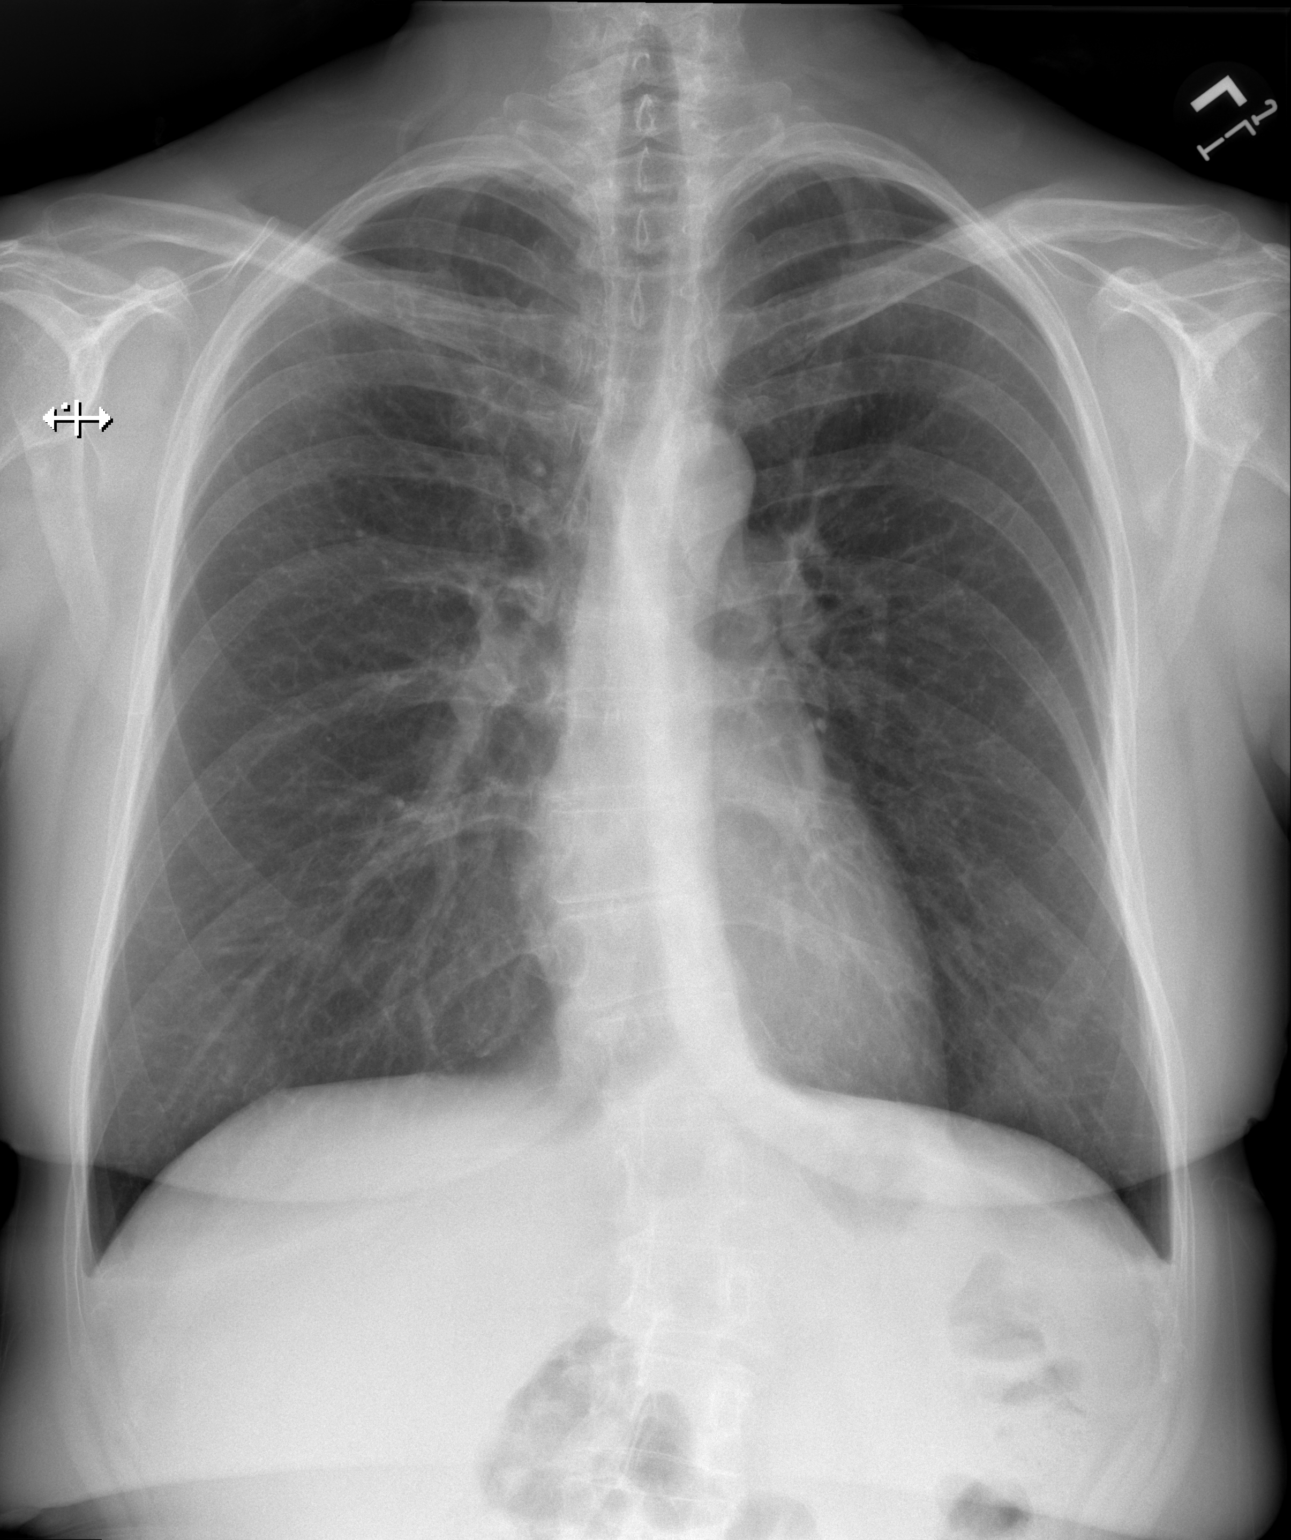

[w chest lat]
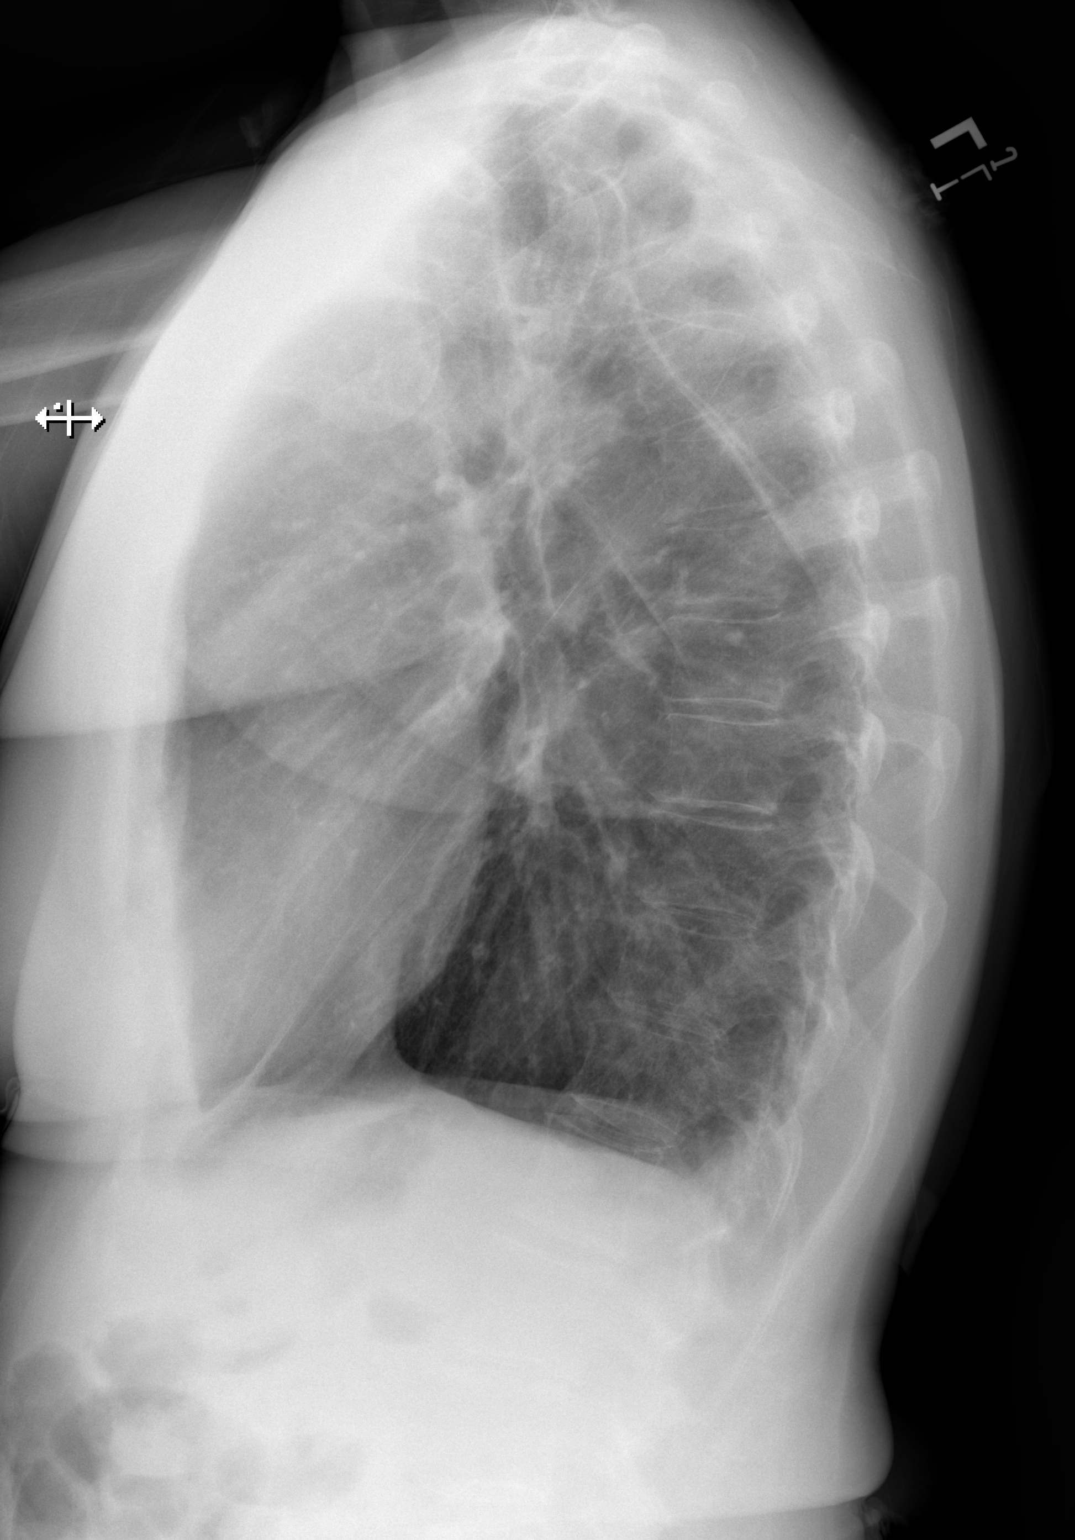

[2 of 2 positions shown; findings below may reference images not displayed]

FINDINGS: There is slight scarring in the right upper lobe. There is no edema
or airspace opacity. Heart size and pulmonary vascularity are
normal. No adenopathy. There is midthoracic dextroscoliosis with
thoracolumbar levoscoliosis.
IMPRESSION: Slight scarring right upper lobe. No edema or airspace opacity.
Heart size normal. Scoliosis.

## 2023-07-03 ENCOUNTER — Other Ambulatory Visit: Payer: Self-pay | Admitting: Family Medicine

## 2023-07-03 ENCOUNTER — Ambulatory Visit
Admission: RE | Admit: 2023-07-03 | Discharge: 2023-07-03 | Disposition: A | Discharge status: Home / Self Care | Payer: Commercial Managed Care - PPO | Source: Ambulatory Visit | Attending: Family Medicine | Admitting: Family Medicine

## 2023-07-03 DIAGNOSIS — R053 Chronic cough: Secondary | ICD-10-CM

## 2023-08-03 ENCOUNTER — Emergency Department (HOSPITAL_COMMUNITY): Payer: Commercial Managed Care - PPO

## 2023-08-03 ENCOUNTER — Encounter (HOSPITAL_COMMUNITY): Payer: Self-pay

## 2023-08-03 ENCOUNTER — Other Ambulatory Visit: Payer: Self-pay

## 2023-08-03 ENCOUNTER — Emergency Department (HOSPITAL_COMMUNITY)
Admission: EM | Admit: 2023-08-03 | Discharge: 2023-08-04 | Disposition: A | Discharge status: Home / Self Care | Payer: Commercial Managed Care - PPO | Attending: Emergency Medicine | Admitting: Emergency Medicine

## 2023-08-03 DIAGNOSIS — R0789 Other chest pain: Secondary | ICD-10-CM

## 2023-08-03 DIAGNOSIS — R0602 Shortness of breath: Secondary | ICD-10-CM | POA: Insufficient documentation

## 2023-08-03 DIAGNOSIS — R110 Nausea: Secondary | ICD-10-CM | POA: Diagnosis not present

## 2023-08-03 LAB — CBC
HCT: 40.4 % (ref 36.0–46.0)
Hemoglobin: 13.1 g/dL (ref 12.0–15.0)
MCH: 31.6 pg (ref 26.0–34.0)
MCHC: 32.4 g/dL (ref 30.0–36.0)
MCV: 97.3 fL (ref 80.0–100.0)
Platelets: 243 10*3/uL (ref 150–400)
RBC: 4.15 MIL/uL (ref 3.87–5.11)
RDW: 13.3 % (ref 11.5–15.5)
WBC: 9.5 10*3/uL (ref 4.0–10.5)
nRBC: 0 % (ref 0.0–0.2)

## 2023-08-03 NOTE — ED Triage Notes (Signed)
Pt arrives with c/o chest pain that started today. Pt endorses nausea. Pt denies SOB. Per pt, pain was a tightening sensation and lasted for about 20 minutes.

## 2023-08-04 ENCOUNTER — Emergency Department (HOSPITAL_COMMUNITY): Payer: Commercial Managed Care - PPO

## 2023-08-04 LAB — HEPATIC FUNCTION PANEL
ALT: 27 U/L (ref 0–44)
AST: 24 U/L (ref 15–41)
Albumin: 3.7 g/dL (ref 3.5–5.0)
Alkaline Phosphatase: 37 U/L — ABNORMAL LOW (ref 38–126)
Bilirubin, Direct: <0.1 mg/dL (ref 0.0–0.2)
Total Bilirubin: 0.4 mg/dL (ref 0.3–1.2)
Total Protein: 6.2 g/dL — ABNORMAL LOW (ref 6.5–8.1)

## 2023-08-04 LAB — BASIC METABOLIC PANEL
Anion gap: 8 (ref 5–15)
BUN: 23 mg/dL — ABNORMAL HIGH (ref 6–20)
CO2: 28 mmol/L (ref 22–32)
Calcium: 9.1 mg/dL (ref 8.9–10.3)
Chloride: 100 mmol/L (ref 98–111)
Creatinine, Ser: 1.06 mg/dL — ABNORMAL HIGH (ref 0.44–1.00)
GFR, Estimated: >60 mL/min (ref >60)
Glucose, Bld: 100 mg/dL — ABNORMAL HIGH (ref 70–99)
Potassium: 3.4 mmol/L — ABNORMAL LOW (ref 3.5–5.1)
Sodium: 136 mmol/L (ref 135–145)

## 2023-08-04 LAB — D-DIMER, QUANTITATIVE: D-Dimer, Quant: <0.27 ug{FEU}/mL (ref 0.00–0.50)

## 2023-08-04 LAB — TROPONIN I (HIGH SENSITIVITY)
Troponin I (High Sensitivity): 2 ng/L (ref <18)
Troponin I (High Sensitivity): 3 ng/L (ref <18)
Troponin I (High Sensitivity): 3 ng/L (ref <18)

## 2023-08-04 LAB — LIPASE, BLOOD: Lipase: 48 U/L (ref 11–51)

## 2023-08-04 MED ORDER — ALUM & MAG HYDROXIDE-SIMETH 200-200-20 MG/5ML PO SUSP
30.0000 mL | Freq: Once | ORAL | Status: AC
  Administered 2023-08-04: 30 mL via ORAL
  Filled 2023-08-04: qty 30

## 2023-08-04 MED ORDER — OMEPRAZOLE 20 MG PO CPDR: 20.0000 mg | DELAYED_RELEASE_CAPSULE | Freq: Every day | ORAL | 0 refills | Status: AC

## 2023-08-04 MED ORDER — LIDOCAINE VISCOUS HCL 2 % MT SOLN
15.0000 mL | Freq: Once | OROMUCOSAL | Status: AC
  Administered 2023-08-04: 15 mL via OROMUCOSAL
  Filled 2023-08-04: qty 15

## 2023-08-04 MED ORDER — IOHEXOL 350 MG/ML SOLN
100.0000 mL | Freq: Once | INTRAVENOUS | Status: AC | PRN
  Administered 2023-08-04: 100 mL via INTRAVENOUS

## 2023-08-04 NOTE — Discharge Instructions (Addendum)
Testing is reassuring.  No evidence of heart attack or blood clot in the lung.  As we discussed you are low risk for heart disease but nonzero risk.  You should follow-up with the heart doctor for a possible stress test.  Take the omeprazole as prescribed.  Avoid alcohol, caffeine, NSAID medications, spicy foods.  Return to the ED if exertional chest pain, pain associate with shortness of breath, nausea, vomiting, sweating or any other concerns.

## 2023-08-04 NOTE — ED Provider Notes (Signed)
[place] EMERGENCY DEPARTMENT AT [facility] Provider Note   CSN: [id number] Arrival date & time: 08/03/23  2154     History  Chief Complaint  Patient presents with   Chest Pain    Tracey Branch is a 62 y.o. female.  Patient with a history of GERD, fibromyalgia, rheumatoid arthritis currently on prednisone taper presenting with episode of chest pain that onset about 10 PM while watching television.  She describes it as pain and pressure and squeezing in the center of her chest with radiation to her neck.  Pain has been constant since about 10 PM but improving.  Was severe for approximately 30 minutes and then has been dull and achy ever since.  Nothing makes it better or worse.  Some associated shortness of breath.  Some nausea.  No vomiting.  No cough or fever.  Has had 1 episode of this pain in the past when she had an EKG and was told it was normal by her PCP.  She is never had a stress test.  Denies cardiac history.  Pain is improving but still constant and persistent in the center of her chest and feels like a burning and tightness.  Shortness of breath and nausea have improved.  No abdominal pain or back pain.  States she has been out of her Prilosec.  No recent black or bloody stools.  The history is provided by the patient and the spouse.  Chest Pain Associated symptoms: nausea and shortness of breath   Associated symptoms: no abdominal pain, no fever, no headache, no vomiting and no weakness        Home Medications Prior to Admission medications   Medication Sig Start Date End Date Taking? Authorizing Provider  celecoxib (CELEBREX) 200 MG capsule  10/10/17   [provider]  DULoxetine (CYMBALTA) 20 MG capsule TAKE 1 CAPSULE DAILY 10/16/19   Margaree Mackintosh, MD  FLUoxetine (PROZAC) 40 MG capsule TAKE 1 CAPSULE DAILY 07/18/21   Margaree Mackintosh, MD  omeprazole (PRILOSEC) 20 MG capsule TAKE 1 CAPSULE DAILY 01/05/22   Margaree Mackintosh, MD  rosuvastatin  (CRESTOR) 5 MG tablet TAKE 1 TABLET DAILY 01/30/22   Margaree Mackintosh, MD      Allergies    Iodine    Review of Systems   Review of Systems  Constitutional:  Negative for activity change, appetite change and fever.  HENT:  Negative for congestion.   Respiratory:  Positive for chest tightness and shortness of breath.   Cardiovascular:  Positive for chest pain.  Gastrointestinal:  Positive for nausea. Negative for abdominal pain and vomiting.  Genitourinary:  Negative for dysuria and hematuria.  Musculoskeletal:  Negative for arthralgias and myalgias.  Skin:  Negative for rash.  Neurological:  Negative for weakness and headaches.    all other systems are negative except as noted in the HPI and PMH.   Physical Exam Updated Vital Signs BP 118/72 (BP Location: Right Arm)   Pulse 65   Temp 97.8 F (36.6 C) (Oral)   Resp 18   Ht 5\' 6"  (1.676 m)   Wt 74.8 kg   SpO2 99%   BMI 26.63 kg/m  Physical Exam Vitals and nursing note reviewed.  Constitutional:      General: She is not in acute distress.    Appearance: She is well-developed.  HENT:     Head: Normocephalic and atraumatic.     Mouth/Throat:     Pharynx: No oropharyngeal exudate.  Eyes:  Conjunctiva/sclera: Conjunctivae normal.     Pupils: Pupils are equal, round, and reactive to light.  Neck:     Comments: No meningismus. Cardiovascular:     Rate and Rhythm: Normal rate and regular rhythm.     Heart sounds: Normal heart sounds. No murmur heard. Pulmonary:     Effort: Pulmonary effort is normal. No respiratory distress.     Breath sounds: Normal breath sounds.  Chest:     Chest wall: No tenderness.  Abdominal:     Palpations: Abdomen is soft.     Tenderness: There is no abdominal tenderness. There is no guarding or rebound.  Musculoskeletal:        General: No tenderness. Normal range of motion.     Cervical back: Normal range of motion and neck supple.  Skin:    General: Skin is warm.  Neurological:      Mental Status: She is alert and oriented to person, place, and time.     Cranial Nerves: No cranial nerve deficit.     Motor: No abnormal muscle tone.     Coordination: Coordination normal.     Comments:  5/5 strength throughout. CN 2-12 intact.Equal grip strength.   Psychiatric:        Behavior: Behavior normal.     ED Results / Procedures / Treatments   Labs (all labs ordered are listed, but only abnormal results are displayed) Labs Reviewed  BASIC METABOLIC PANEL - Abnormal; Notable for the following components:      Result Value   Potassium 3.4 (*)    Glucose, Bld 100 (*)    BUN 23 (*)    Creatinine, Ser 1.06 (*)    All other components within normal limits  HEPATIC FUNCTION PANEL - Abnormal; Notable for the following components:   Total Protein 6.2 (*)    Alkaline Phosphatase 37 (*)    All other components within normal limits  CBC  D-DIMER, QUANTITATIVE  LIPASE, BLOOD  TROPONIN I (HIGH SENSITIVITY)  TROPONIN I (HIGH SENSITIVITY)  TROPONIN I (HIGH SENSITIVITY)  TROPONIN I (HIGH SENSITIVITY)    EKG EKG Interpretation Date/Time:  Friday August 03 2023 22:07:49 EDT Ventricular Rate:  68 PR Interval:  168 QRS Duration:  114 QT Interval:  448 QTC Calculation: 477 R Axis:   29  Text Interpretation: Sinus rhythm Probable left atrial enlargement Borderline intraventricular conduction delay RSR' in V1 or V2, probably normal variant ST elevation, consider inferior injury No previous ECGs available Confirmed by Glynn Octave [phone]) on 08/04/2023 3:22:17 AM  Radiology CT Angio Chest/Abd/Pel for Dissection W and/or Wo Contrast  Result Date: 08/04/2023 CLINICAL DATA:  Chest pain that started today EXAM: CT ANGIOGRAPHY CHEST, ABDOMEN AND PELVIS TECHNIQUE: Non-contrast CT of the chest was initially obtained. Multidetector CT imaging through the chest, abdomen and pelvis was performed using the standard protocol during bolus administration of intravenous contrast. Multiplanar  reconstructed images and MIPs were obtained and reviewed to evaluate the vascular anatomy. RADIATION DOSE REDUCTION: This exam was performed according to the departmental dose-optimization program which includes automated exposure control, adjustment of the mA and/or kV according to patient size and/or use of iterative reconstruction technique. CONTRAST:  OMNIPAQUE IOHEXOL 350 MG/ML SOLN COMPARISON:  None Available. FINDINGS: CTA CHEST FINDINGS Cardiovascular: Preferential opacification of the thoracic aorta. No evidence of thoracic aortic aneurysm or dissection. Normal heart size. No pericardial effusion. Mediastinum/Nodes: No hematoma, mass, or adenopathy Lungs/Pleura: There is no edema, consolidation, effusion, or pneumothorax. Musculoskeletal: No acute finding Review  of the MIP images confirms the above findings. CTA ABDOMEN AND PELVIS FINDINGS VASCULAR Aorta: Unremarkable.  No atheromatous change or aneurysm. Celiac: Unremarkable SMA: Unremarkable Renals: Single bilateral renal arteries are smoothly contoured and widely patent. IMA: Patent Inflow: Vessels are smoothly contoured and widely patent Veins: Unremarkable Review of the MIP images confirms the above findings. NON-VASCULAR Hepatobiliary: No focal liver abnormality.No evidence of biliary obstruction or stone. Pancreas: Unremarkable. Spleen: Unremarkable. Adrenals/Urinary Tract: Negative adrenals. No hydronephrosis or stone. Unremarkable bladder. Stomach/Bowel:  No obstruction. No appendicitis. Lymphatic: No mass or adenopathy. Reproductive:No pathologic findings. Other: No ascites or pneumoperitoneum. Musculoskeletal: No acute abnormalities. Mild scoliosis and degeneration. Review of the MIP images confirms the above findings. IMPRESSION: Negative CTA. No acute aortic syndrome other explanation for symptoms. Electronically Signed   By: Tiburcio Pea M.D.   On: 08/04/2023 06:47   DG Chest Port 1 View  Result Date: 08/03/2023 CLINICAL DATA:   Chest pain.  Nausea. EXAM: PORTABLE CHEST 1 VIEW COMPARISON:  07/03/2023 FINDINGS: The apices are excluded from the field of view.The cardiomediastinal contours are normal. Pulmonary vasculature is normal. No consolidation, pleural effusion, or visible pneumothorax. Scoliotic curvature of the spine. No acute osseous abnormalities are seen. IMPRESSION: No acute chest findings. Electronically Signed   By: Narda Rutherford M.D.   On: 08/03/2023 23:44    Procedures Procedures    Medications Ordered in ED Medications  alum & mag hydroxide-simeth (MAALOX/MYLANTA) 200-200-20 MG/5ML suspension 30 mL (has no administration in time range)  lidocaine (XYLOCAINE) 2 % viscous mouth solution 15 mL (has no administration in time range)    ED Course/ Medical Decision Making/ A&P                                 Medical Decision Making Amount and/or Complexity of Data Reviewed Independent Historian: spouse Labs: ordered. Decision-making details documented in ED Course. Radiology: ordered and independent interpretation performed. Decision-making details documented in ED Course. ECG/medicine tests: ordered and independent interpretation performed. Decision-making details documented in ED Course.  Risk OTC drugs. Prescription drug management.   Episode of central chest pain since 10:00 gradually improving.  Associate with shortness of breath and nausea.  Vital stable, no distress.  EKG is normal sinus rhythm without acute ST changes.  Patient given GI cocktail.  Labs are reassuring with troponin negative x 2.  Chest pain may be GI in origin as she is been out of her Prilosec  Discussed she is low risk for heart disease but nonzero risk.  Recommend follow-up for outpatient stress test.  Return to the ED with exertional chest pain, pain associate with shortness of breath, nausea, vomiting, sweating or other concerns  Troponin negative x 2.  D-dimer is negative.  Low suspicion for ACS, PE, aortic  dissection.  CT is negative for any acute aortic pathology.  Pain has not improved with GI cocktail but remains dull and achy.  No evidence of MI.  Discussed with patient she is low risk for heart disease but nonzero risk.  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 Discussed continuing her Prilosec while she is taking prednisone.  Avoid alcohol, caffeine, NSAID medications and spicy foods.  Follow up with her PCP.  Will also refer to cardiology for stress test.  Return to the ED with exertional chest pain, pain associate with shortness of breath, nausea, vomiting, sweating or other concerns.  Final Clinical Impression(s) / ED Diagnoses Final diagnoses:  Atypical chest pain    Rx / DC Orders ED Discharge Orders     None         Shakiya Mcneary, Jeannett Senior, MD 08/04/23 [phone]

## 2023-09-14 ENCOUNTER — Ambulatory Visit: Payer: Commercial Managed Care - PPO | Attending: Cardiology | Admitting: Cardiology

## 2023-09-14 ENCOUNTER — Encounter: Payer: Self-pay | Admitting: Cardiology

## 2023-09-14 VITALS — BP 124/76 | HR 76 | Ht 66.0 in | Wt 169.0 lb

## 2023-09-14 DIAGNOSIS — M069 Rheumatoid arthritis, unspecified: Secondary | ICD-10-CM

## 2023-09-14 DIAGNOSIS — R011 Cardiac murmur, unspecified: Secondary | ICD-10-CM | POA: Diagnosis not present

## 2023-09-14 DIAGNOSIS — E7800 Pure hypercholesterolemia, unspecified: Secondary | ICD-10-CM | POA: Diagnosis not present

## 2023-09-14 DIAGNOSIS — R072 Precordial pain: Secondary | ICD-10-CM | POA: Diagnosis not present

## 2023-09-14 DIAGNOSIS — Z01812 Encounter for preprocedural laboratory examination: Secondary | ICD-10-CM

## 2023-09-14 MED ORDER — METOPROLOL TARTRATE 100 MG PO TABS: 100.0000 mg | ORAL_TABLET | ORAL | 0 refills | Status: DC

## 2023-09-14 NOTE — Patient Instructions (Addendum)
Medication Instructions:  The current medical regimen is effective;  continue present plan and medications.  *If you need a refill on your cardiac medications before your next appointment, please call your pharmacy*   Lab Work: Please have blood work today (BMP)  If you have labs (blood work) drawn today and your tests are completely normal, you will receive your results only by: MyChart Message (if you have MyChart) OR A paper copy in the mail If you have any lab test that is abnormal or we need to change your treatment, we will call you to review the results.   Testing/Procedures: Your physician has requested that you have an echocardiogram. Echocardiography is a painless test that uses sound waves to create images of your heart. It provides your doctor with information about the size and shape of your heart and how well your heart's chambers and valves are working. This procedure takes approximately one hour. There are no restrictions for this procedure. Please do NOT wear cologne, perfume, aftershave, or lotions (deodorant is allowed). Please arrive 15 minutes prior to your appointment time.    Your cardiac CT will be scheduled at:   [facility] [address] [place], [place] [address] [phone]  Please arrive at the [facility] and Children's Entrance (Entrance C2) of [facility] 30 minutes prior to test start time. You can use the FREE valet parking offered at entrance C (encouraged to control the heart rate for the test)  Proceed to the [facility] Radiology Department (first floor) to check-in and test prep.  All radiology patients and guests should use entrance C2 at [facility], accessed from [facility], even though the hospital's physical address listed is [address].     Please follow these instructions carefully (unless otherwise directed):  An IV will be required for this test and Nitroglycerin will be given.   Hold all erectile dysfunction medications at least 3 days (72 hrs) prior to test. (Ie viagra, cialis, sildenafil, tadalafil, etc)   On the Night Before the Test: Be sure to Drink plenty of water. Do not consume any caffeinated/decaffeinated beverages or chocolate 12 hours prior to your test. Do not take any antihistamines 12 hours prior to your test.  On the Day of the Test: Drink plenty of water until 1 hour prior to the test. Do not eat any food 1 hour prior to test. You may take your regular medications prior to the test.  Take metoprolol (Lopressor) two hours prior to test. If you take Furosemide/Hydrochlorothiazide/Spironolactone, please HOLD on the morning of the test. FEMALES- please wear underwire-free bra if available, avoid dresses & tight clothing  After the Test: Drink plenty of water. After receiving IV contrast, you may experience a mild flushed feeling. This is normal. On occasion, you may experience a mild rash up to 24 hours after the test. This is not dangerous. If this occurs, you can take Benadryl 25 mg and increase your fluid intake. If you experience trouble breathing, this can be serious. If it is severe call 911 IMMEDIATELY. If it is mild, please call our office.  We will call to schedule your test 2-4 weeks out understanding that some insurance companies will need an authorization prior to the service being performed.   For more information and frequently asked questions, please visit our website : [email or web address]  For non-scheduling related questions, please contact the cardiac imaging nurse navigator should you have any questions/concerns: Cardiac Imaging Nurse Navigators Direct  Office Dial: [phone]   For scheduling needs, including cancellations and rescheduling, please call [place], [phone].   Follow-Up: At [facility], you and your health needs are our priority.  As part of our continuing mission to provide you  with exceptional heart care, we have created designated Provider Care Teams.  These Care Teams include your primary Cardiologist (physician) and Advanced Practice Providers (APPs -  Physician Assistants and Nurse Practitioners) who all work together to provide you with the care you need, when you need it.  We recommend signing up for the patient portal called "MyChart".  Sign up information is provided on this After Visit Summary.  MyChart is used to connect with patients for Virtual Visits (Telemedicine).  Patients are able to view lab/test results, encounter notes, upcoming appointments, etc.  Non-urgent messages can be sent to your provider as well.   To learn more about what you can do with MyChart, go to [email or web address].    Your next appointment:   Follow up will be based on the results of the above testing.

## 2023-09-14 NOTE — Progress Notes (Signed)
Cardiology Office Note:  .   Date:  09/14/2023  ID:  Tracey Branch, DOB [birth year], MRN [id number] PCP: Harvest Forest, MD  [facility] Providers Cardiologist:  None     History of Present Illness: .   Tracey Branch is a 62 y.o. female Discussed with the use of AI scribe software   History of Present Illness   A 62 year old patient with a history of gastroesophageal reflux disease (GERD), fibromyalgia, and rheumatoid arthritis presents for evaluation of chest pain. The patient was recently seen in the emergency department on August 04, 2023, following an episode of chest pain that occurred at around 10 PM while watching television. The pain was described as a pressure-like squeezing sensation in the center of the chest, radiating to the neck. The pain was constant and severe for about 30 minutes, then transitioned to a dull ache. The patient may have experienced associated shortness of breath during this episode.  This is not the first occurrence of such an episode. The patient has previously had an EKG at another office, which was reportedly normal. The EKG from the recent emergency department visit showed a heart rate of 68 beats per minute with no significant abnormalities. A CT scan of the chest performed on August 04, 2023, was negative for aortic syndrome and pulmonary embolism.  The patient had been on a prednisone taper at the time of the chest pain episode.   Father died of aortic aneurysm in his late 25s.  She is also been told in the past that she has a heart murmur.  Non-smoker.  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    Studies Reviewed: Marland Kitchen   EKG Interpretation Date/Time:  Friday September 14 2023 09:56:38 EDT Ventricular Rate:  71 PR Interval:  166 QRS Duration:  90 QT Interval:  428 QTC Calculation: 465 R Axis:   4  Text  Interpretation: Normal sinus rhythm Borderline Inferior infarct , age undetermined likely normal When compared with ECG of 04-Aug-2023 04:05, PREVIOUS ECG IS PRESENT Confirmed by Donato Schultz ([id number]) on 09/14/2023 10:01:10 AM    Prior LDL in the 80s in 2022 Risk Assessment/Calculations:            Physical Exam:   VS:  BP 124/76   Pulse 76   Ht 5\' 6"  (1.676 m)   Wt 169 lb (76.7 kg)   SpO2 98%   BMI 27.28 kg/m    Wt Readings from Last 3 Encounters:  09/14/23 169 lb (76.7 kg)  08/04/23 165 lb (74.8 kg)  06/17/21 156 lb (70.8 kg)    GEN: Well nourished, well developed in no acute distress NECK: No JVD; No carotid bruits CARDIAC: RRR, soft systolic murmur, no rubs, no gallops RESPIRATORY:  Clear to auscultation without rales, wheezing or rhonchi  ABDOMEN: Soft, non-tender, non-distended EXTREMITIES:  No edema; No deformity   ASSESSMENT AND PLAN: .    Assessment and Plan    Chest Pain Recent episode of severe, central chest pressure with radiation to the neck, lasting for 30 minutes, possibly associated with shortness of breath. EKG showed subtle Q waves in the inferior leads, likely normal. CT chest negative for aortic syndrome and PE.  It is possible that the prednisone taper may have influenced her chest discomfort. - Further diagnostic evaluation needed to rule out cardiac etiology. -We will go ahead and proceed with coronary CT scan  with plaque analysis.  Family history of aortic aneurysm -Father died in his late 30s from ruptured aortic aneurysm.  Thankfully her CT scan of her chest in the emergency department was unremarkable for aortopathy.  GERD Chronic condition, no recent exacerbation mentioned. - Continue current management.  Has been on omeprazole.  Rheumatoid Arthritis Chronic condition, no recent exacerbation mentioned. - Continue current management.  Sees local rheumatologist.              Signed, Donato Schultz, MD

## 2023-09-15 LAB — BASIC METABOLIC PANEL
BUN/Creatinine Ratio: 23 (ref 12–28)
BUN: 21 mg/dL (ref 8–27)
CO2: 27 mmol/L (ref 20–29)
Calcium: 9.5 mg/dL (ref 8.7–10.3)
Chloride: 101 mmol/L (ref 96–106)
Creatinine, Ser: 0.92 mg/dL (ref 0.57–1.00)
Glucose: 77 mg/dL (ref 70–99)
Potassium: 4.3 mmol/L (ref 3.5–5.2)
Sodium: 141 mmol/L (ref 134–144)
eGFR: 71 mL/min/{1.73_m2} (ref >59)

## 2023-10-01 ENCOUNTER — Ambulatory Visit (HOSPITAL_COMMUNITY): Payer: Commercial Managed Care - PPO | Attending: Internal Medicine

## 2023-10-01 DIAGNOSIS — R072 Precordial pain: Secondary | ICD-10-CM | POA: Diagnosis not present

## 2023-10-01 LAB — ECHOCARDIOGRAM COMPLETE
Area-P 1/2: 3.42 cm2
S' Lateral: 1.9 cm

## 2023-10-18 ENCOUNTER — Ambulatory Visit (HOSPITAL_COMMUNITY): Admission: RE | Admit: 2023-10-18 | Payer: Commercial Managed Care - PPO | Source: Ambulatory Visit

## 2023-10-30 ENCOUNTER — Encounter (HOSPITAL_COMMUNITY): Payer: Self-pay

## 2023-10-31 ENCOUNTER — Telehealth (HOSPITAL_COMMUNITY): Payer: Self-pay | Admitting: *Deleted

## 2023-10-31 ENCOUNTER — Other Ambulatory Visit (HOSPITAL_COMMUNITY): Payer: Self-pay | Admitting: *Deleted

## 2023-10-31 MED ORDER — METOPROLOL TARTRATE 100 MG PO TABS: 100.0000 mg | ORAL_TABLET | ORAL | 0 refills | Status: DC

## 2023-10-31 NOTE — Telephone Encounter (Signed)
Reaching out to patient to offer assistance regarding upcoming cardiac imaging study; pt verbalizes understanding of appt date/time, parking situation and where to check in, pre-test NPO status and medications ordered, and verified current allergies; name and call back number provided for further questions should they arise  Larey Brick RN Navigator Cardiac Imaging [facility] [phone] office [phone] cell  Patient to take 100mg  metoprolol tartrate two hours prior to her cardiac CT scan. She is aware to arrive at 3:30 PM

## 2023-11-01 ENCOUNTER — Ambulatory Visit (HOSPITAL_COMMUNITY): Admission: RE | Admit: 2023-11-01 | Payer: Commercial Managed Care - PPO | Source: Ambulatory Visit

## 2023-11-16 IMAGING — US US ABDOMEN COMPLETE
1 series · 14 of 25 positions shown · non-contrast
Comparison: None.

CLINICAL DATA: Elevated LFTs

EXAM:
ABDOMEN ULTRASOUND COMPLETE

[Series 1: us abdomen complete · 0.18mm/px · 14 of 128 slices shown]
[im 1/128]
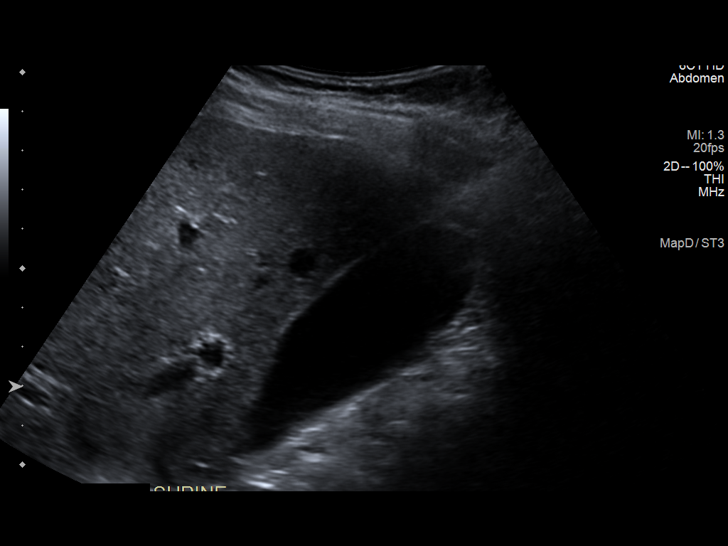
[im 11/128]
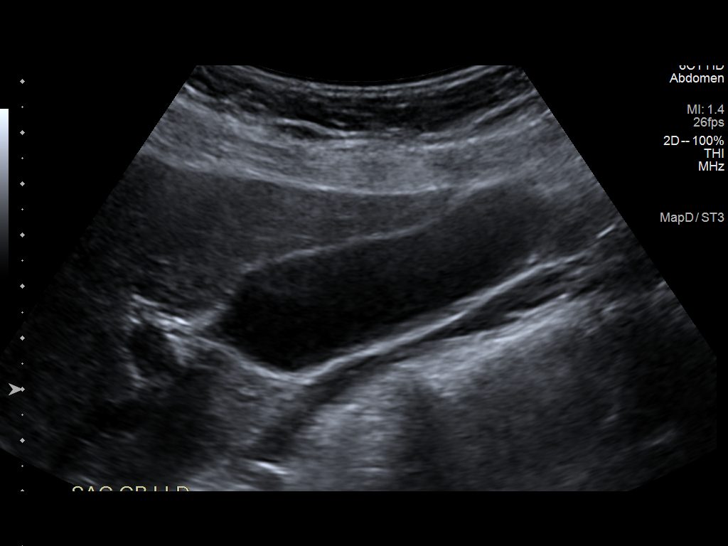
[im 22/128]
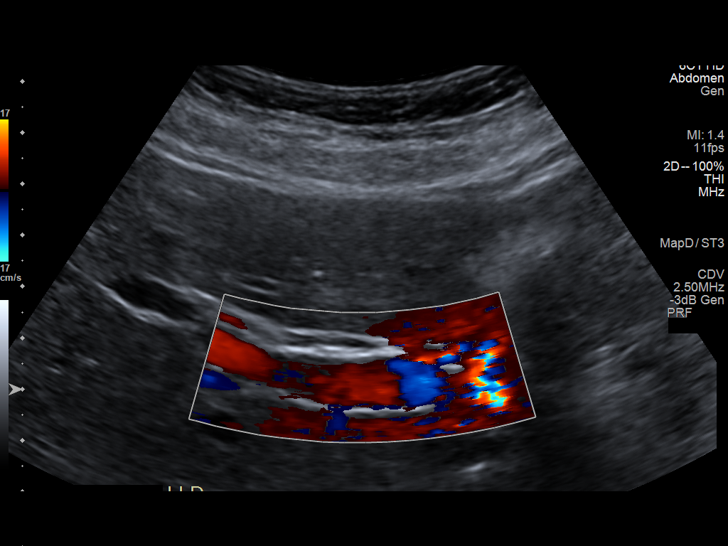
[im 32/128]
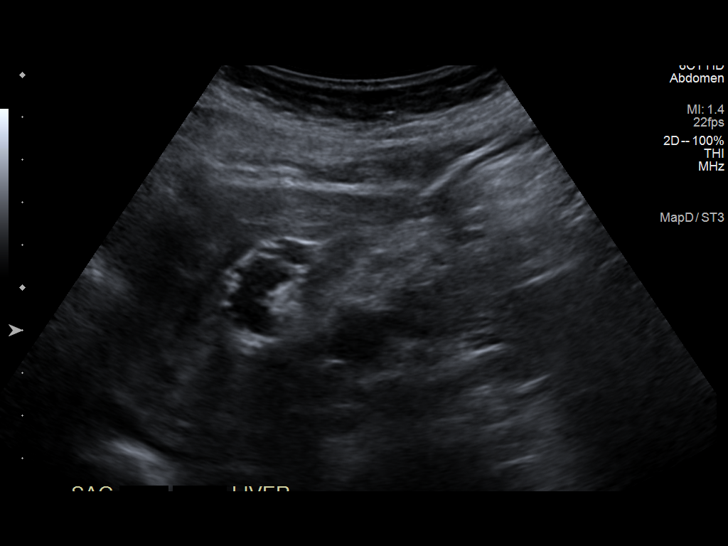
[im 43/128]
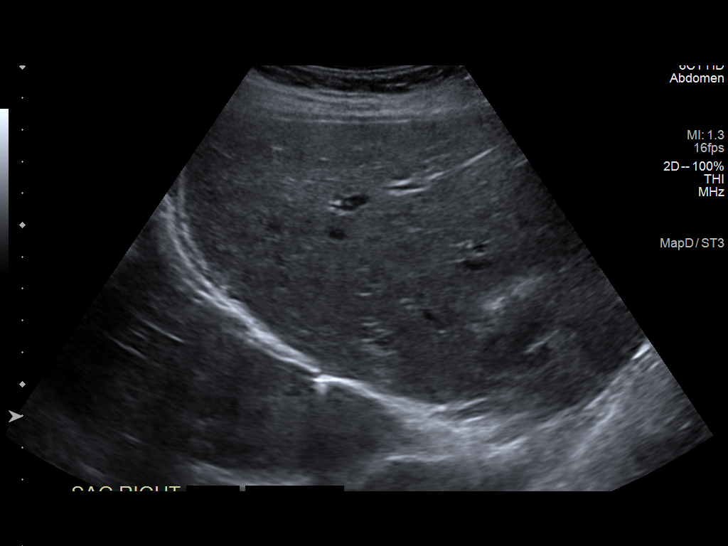
[im 48/128]
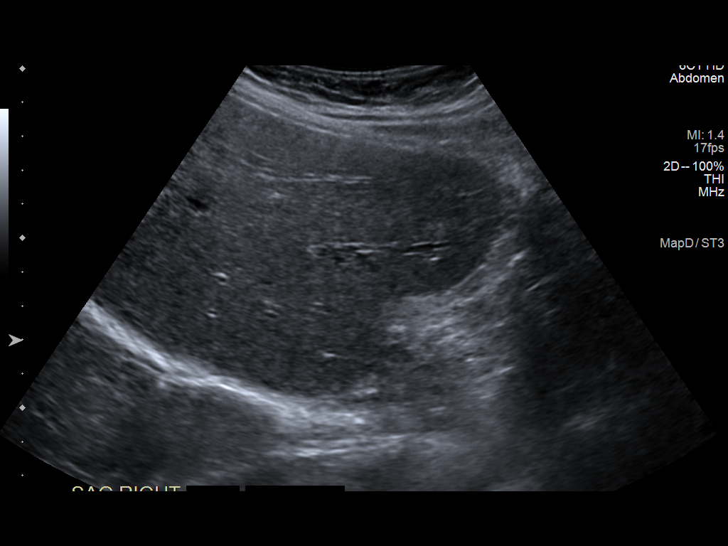
[im 59/128]
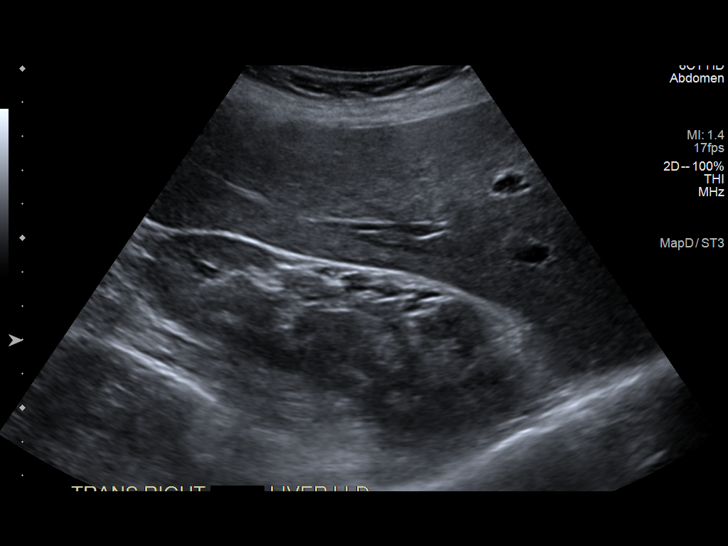
[im 69/128]
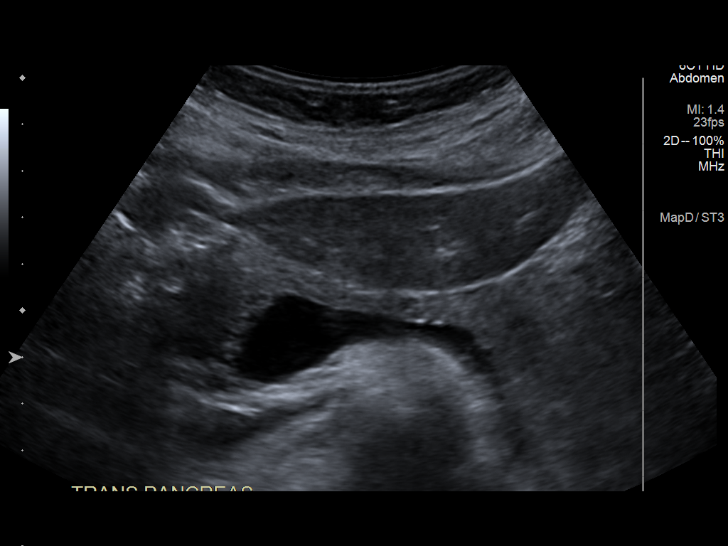
[im 80/128]
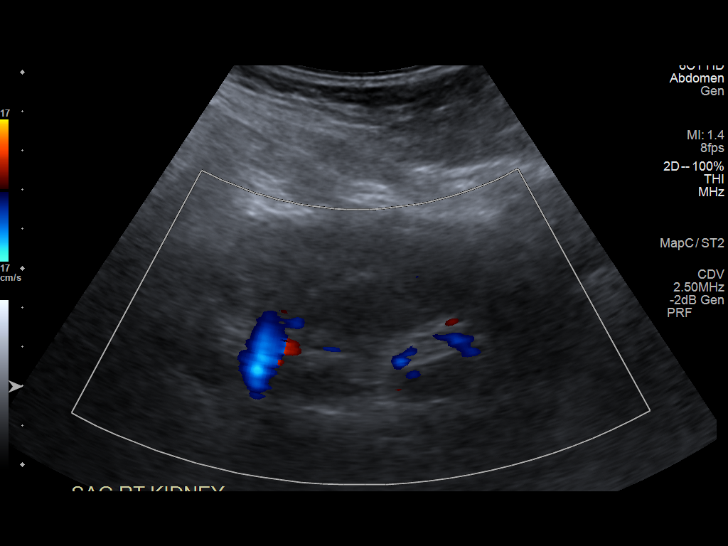
[im 85/128]
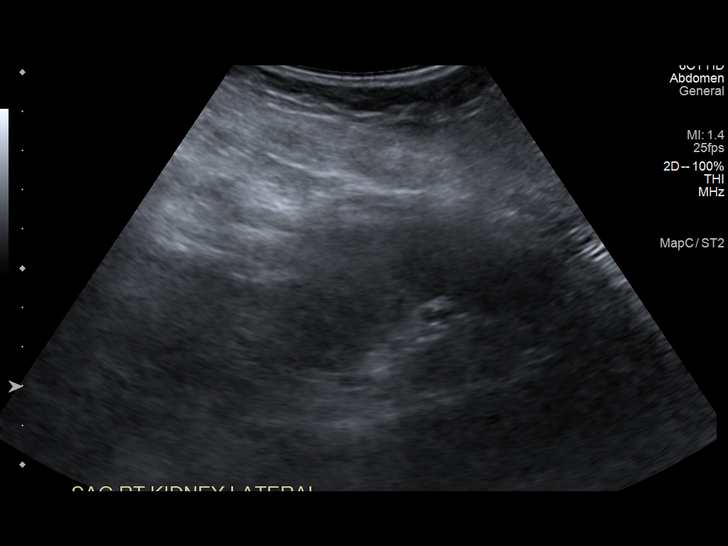
[im 96/128]
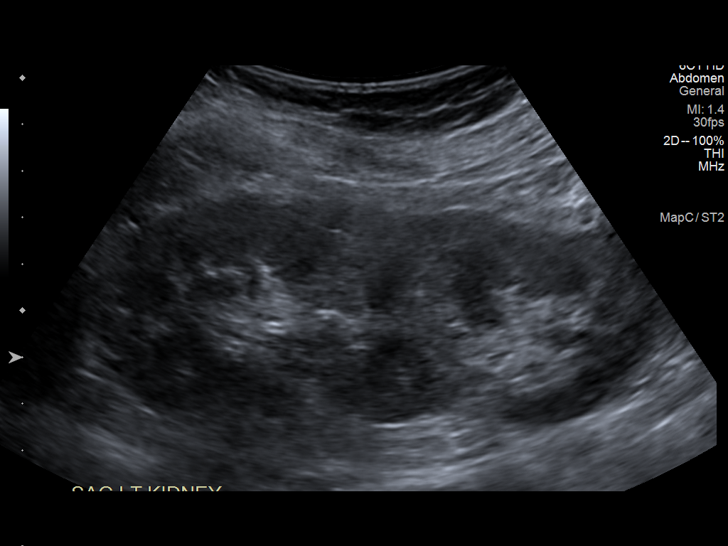
[im 106/128]
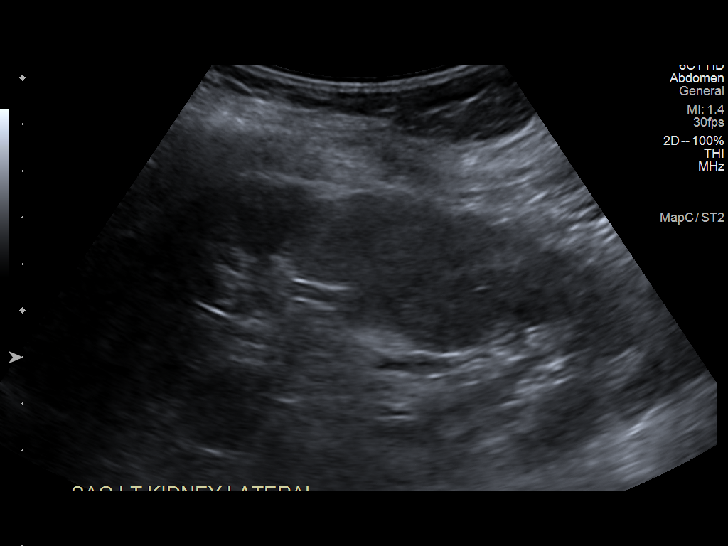
[im 117/128]
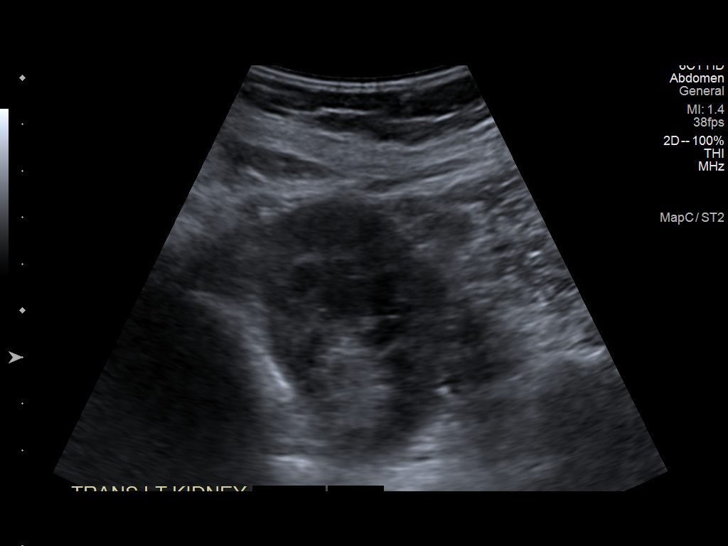
[im 128/128]
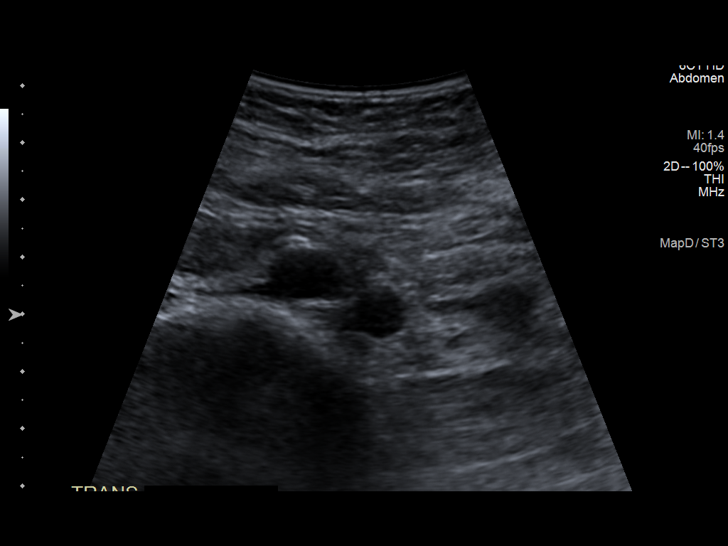

[14 of 25 positions shown; findings below may reference images not displayed]

FINDINGS: Gallbladder: No gallstones or wall thickening visualized. No
sonographic Murphy sign noted by sonographer.

Common bile duct: Diameter: 1.8 mm.

Liver: Mild increased echogenicity is noted likely related to fatty
infiltration. No focal mass is noted. Portal vein is patent on color
Doppler imaging with normal direction of blood flow towards the
liver.

IVC: No abnormality visualized.

Pancreas: Visualized portion unremarkable.

Spleen: Size and appearance within normal limits.

Right Kidney: Length: 10.5 cm. Echogenicity within normal limits.
No mass or hydronephrosis visualized.

Left Kidney: Length: 11.5 cm. Echogenicity within normal limits. No
mass or hydronephrosis visualized.

Abdominal aorta: No aneurysm visualized.

Other findings: None.
IMPRESSION: Mild fatty infiltration.  No other focal abnormality is noted.

## 2023-11-30 ENCOUNTER — Telehealth (HOSPITAL_COMMUNITY): Payer: Self-pay | Admitting: Emergency Medicine

## 2023-11-30 MED ORDER — METOPROLOL TARTRATE 100 MG PO TABS: 100.0000 mg | ORAL_TABLET | ORAL | 0 refills | Status: DC

## 2023-11-30 NOTE — Telephone Encounter (Signed)
Reaching out to patient to offer assistance regarding upcoming cardiac imaging study; pt verbalizes understanding of appt date/time, parking situation and where to check in, pre-test NPO status and medications ordered, and verified current allergies; name and call back number provided for further questions should they arise Cayne Yom RN Navigator Cardiac Imaging [facility] [phone] office [phone] cell 

## 2023-12-03 ENCOUNTER — Ambulatory Visit (HOSPITAL_COMMUNITY)
Admission: RE | Admit: 2023-12-03 | Discharge: 2023-12-03 | Disposition: A | Discharge status: Home / Self Care | Payer: Commercial Managed Care - PPO | Source: Ambulatory Visit | Attending: Cardiology | Admitting: Cardiology

## 2023-12-03 DIAGNOSIS — R072 Precordial pain: Secondary | ICD-10-CM | POA: Diagnosis not present

## 2023-12-03 MED ORDER — NITROGLYCERIN 0.4 MG SL SUBL
0.8000 mg | SUBLINGUAL_TABLET | Freq: Once | SUBLINGUAL | Status: AC
  Administered 2023-12-03: 0.8 mg via SUBLINGUAL

## 2023-12-03 MED ORDER — NITROGLYCERIN 0.4 MG SL SUBL
SUBLINGUAL_TABLET | SUBLINGUAL | Status: AC
  Filled 2023-12-03: qty 2

## 2023-12-03 MED ORDER — IOHEXOL 350 MG/ML SOLN
95.0000 mL | Freq: Once | INTRAVENOUS | Status: AC | PRN
  Administered 2023-12-03: 95 mL via INTRAVENOUS

## 2023-12-24 ENCOUNTER — Telehealth: Payer: Self-pay | Admitting: *Deleted

## 2023-12-24 NOTE — Telephone Encounter (Signed)
   Pre-operative Risk Assessment    Patient Name: Tracey Branch  DOB: 1961-10-24 MRN: [id number]   Date of last office visit: 09/14/23 DR. SKAINS Date of next office visit: NONE   Request for Surgical Clearance    Procedure:   RIGHT TOTAL KNEE ARTHROPLASTY  Date of Surgery:  Clearance TBD                                Surgeon:  DR. DANIEL MARCHWIANY Surgeon's Group or Practice Name:  [facility] Phone number:  [phone] EXT [address] Fax number:  [phone]   Type of Clearance Requested:   - Medical ; NO MEDICATIONS HAVE BEEN LISTED AS NEEDING TO BE HELD   Type of Anesthesia:  Spinal   Additional requests/questions:    Bonney Niels Jest   12/24/2023, 2:05 PM

## 2023-12-24 NOTE — Telephone Encounter (Signed)
   Name: Tracey Branch  DOB: May 03, 1961  MRN: [id number]  Primary Cardiologist: Dr. Jeffrie Seen last on 09/14/2023   Preoperative team, please contact this patient and set up a phone call appointment for further preoperative risk assessment. Please obtain consent and complete medication review. Thank you for your help.  I confirm that guidance regarding antiplatelet and oral anticoagulation therapy has been completed and, if necessary, noted below. Patient not on anticoagulation or antiplatelet therapy per most recent notes.   I also confirmed the patient resides in the state of [place] . As per [facility] Medical Board telemedicine laws, the patient must reside in the state in which the provider is licensed.   Lamarr Satterfield, NP 12/24/2023, 3:35 PM [facility]

## 2023-12-25 ENCOUNTER — Telehealth: Payer: Self-pay

## 2023-12-25 NOTE — Telephone Encounter (Signed)
 Patient has been scheduled for tele visit for preop clearance patient voiced understanding consent done

## 2023-12-25 NOTE — Telephone Encounter (Signed)
 Patient has been scheduled for telephone visit for preop clearance, consent done     Patient Consent for Virtual Visit         Tracey Branch has provided verbal consent on 12/25/2023 for a virtual visit (video or telephone).   CONSENT FOR VIRTUAL VISIT FOR:  Tracey Branch  By participating in this virtual visit I agree to the following:  I hereby voluntarily request, consent and authorize [facility] and its employed or contracted physicians, physician assistants, nurse practitioners or other licensed health care professionals (the Practitioner), to provide me with telemedicine health care services (the "Services) as deemed necessary by the treating Practitioner. I acknowledge and consent to receive the Services by the Practitioner via telemedicine. I understand that the telemedicine visit will involve communicating with the Practitioner through live audiovisual communication technology and the disclosure of certain medical information by electronic transmission. I acknowledge that I have been given the opportunity to request an in-person assessment or other available alternative prior to the telemedicine visit and am voluntarily participating in the telemedicine visit.  I understand that I have the right to withhold or withdraw my consent to the use of telemedicine in the course of my care at any time, without affecting my right to future care or treatment, and that the Practitioner or I may terminate the telemedicine visit at any time. I understand that I have the right to inspect all information obtained and/or recorded in the course of the telemedicine visit and may receive copies of available information for a reasonable fee.  I understand that some of the potential risks of receiving the Services via telemedicine include:  Delay or interruption in medical evaluation due to technological equipment failure or disruption; Information transmitted may not be sufficient (e.g. poor  resolution of images) to allow for appropriate medical decision making by the Practitioner; and/or  In rare instances, security protocols could fail, causing a breach of personal health information.  Furthermore, I acknowledge that it is my responsibility to provide information about my medical history, conditions and care that is complete and accurate to the best of my ability. I acknowledge that Practitioner's advice, recommendations, and/or decision may be based on factors not within their control, such as incomplete or inaccurate data provided by me or distortions of diagnostic images or specimens that may result from electronic transmissions. I understand that the practice of medicine is not an exact science and that Practitioner makes no warranties or guarantees regarding treatment outcomes. I acknowledge that a copy of this consent can be made available to me via my patient portal [facility] MyChart), or I can request a printed copy by calling the office of [facility].    I understand that my insurance will be billed for this visit.   I have read or had this consent read to me. I understand the contents of this consent, which adequately explains the benefits and risks of the Services being provided via telemedicine.  I have been provided ample opportunity to ask questions regarding this consent and the Services and have had my questions answered to my satisfaction. I give my informed consent for the services to be provided through the use of telemedicine in my medical care

## 2024-01-10 ENCOUNTER — Ambulatory Visit: Payer: Commercial Managed Care - PPO | Attending: Cardiology

## 2024-01-10 DIAGNOSIS — Z01810 Encounter for preprocedural cardiovascular examination: Secondary | ICD-10-CM

## 2024-01-10 NOTE — Telephone Encounter (Signed)
Pt states she is returning call. Per secure chat with Verdon Cummins, NP she needs to r/s.

## 2024-01-10 NOTE — Progress Notes (Signed)
Attempted to contact patient x 4.  Four voice messages left.  Patient will need to reschedule her appointment for a different date and time.  Thomasene Ripple. Riyah Bardon NP-C     01/10/2024, 3:18 PM [facility] [address] Office [phone] Fax [phone]

## 2024-01-14 ENCOUNTER — Telehealth: Payer: Commercial Managed Care - PPO

## 2024-01-14 ENCOUNTER — Telehealth: Payer: Self-pay

## 2024-01-14 NOTE — Telephone Encounter (Signed)
Returned call to pt.  Left a message for her to call back and ask for the preop team.

## 2024-01-14 NOTE — Telephone Encounter (Signed)
Patient has been scheduled for telephone visit med rec and consent done     Patient Consent for Virtual Visit         Tracey Branch has provided verbal consent on 01/14/2024 for a virtual visit (video or telephone).   CONSENT FOR VIRTUAL VISIT FOR:  Tracey Branch  By participating in this virtual visit I agree to the following:  I hereby voluntarily request, consent and authorize [facility] and its employed or contracted physicians, physician assistants, nurse practitioners or other licensed health care professionals (the Practitioner), to provide me with telemedicine health care services (the "Services") as deemed necessary by the treating Practitioner. I acknowledge and consent to receive the Services by the Practitioner via telemedicine. I understand that the telemedicine visit will involve communicating with the Practitioner through live audiovisual communication technology and the disclosure of certain medical information by electronic transmission. I acknowledge that I have been given the opportunity to request an in-person assessment or other available alternative prior to the telemedicine visit and am voluntarily participating in the telemedicine visit.  I understand that I have the right to withhold or withdraw my consent to the use of telemedicine in the course of my care at any time, without affecting my right to future care or treatment, and that the Practitioner or I may terminate the telemedicine visit at any time. I understand that I have the right to inspect all information obtained and/or recorded in the course of the telemedicine visit and may receive copies of available information for a reasonable fee.  I understand that some of the potential risks of receiving the Services via telemedicine include:  Delay or interruption in medical evaluation due to technological equipment failure or disruption; Information transmitted may not be sufficient (e.g. poor resolution of  images) to allow for appropriate medical decision making by the Practitioner; and/or  In rare instances, security protocols could fail, causing a breach of personal health information.  Furthermore, I acknowledge that it is my responsibility to provide information about my medical history, conditions and care that is complete and accurate to the best of my ability. I acknowledge that Practitioner's advice, recommendations, and/or decision may be based on factors not within their control, such as incomplete or inaccurate data provided by me or distortions of diagnostic images or specimens that may result from electronic transmissions. I understand that the practice of medicine is not an exact science and that Practitioner makes no warranties or guarantees regarding treatment outcomes. I acknowledge that a copy of this consent can be made available to me via my patient portal [facility] MyChart), or I can request a printed copy by calling the office of  [facility].    I understand that my insurance will be billed for this visit.   I have read or had this consent read to me. I understand the contents of this consent, which adequately explains the benefits and risks of the Services being provided via telemedicine.  I have been provided ample opportunity to ask questions regarding this consent and the Services and have had my questions answered to my satisfaction. I give my informed consent for the services to be provided through the use of telemedicine in my medical care

## 2024-01-14 NOTE — Telephone Encounter (Signed)
 Patient has been scheduled for telephone visit.

## 2024-01-28 ENCOUNTER — Ambulatory Visit: Payer: Commercial Managed Care - PPO | Attending: Cardiovascular Disease | Admitting: Student

## 2024-01-28 DIAGNOSIS — Z01810 Encounter for preprocedural cardiovascular examination: Secondary | ICD-10-CM | POA: Diagnosis not present

## 2024-01-28 NOTE — Progress Notes (Signed)
 Virtual Visit via Telephone Note   Because of Sharlyn Damas's co-morbid illnesses, she is at least at moderate risk for complications without adequate follow up.  This format is felt to be most appropriate for this patient at this time.  The patient did not have access to video technology/had technical difficulties with video requiring transitioning to audio format only (telephone).  All issues noted in this document were discussed and addressed.  No physical exam could be performed with this format.  Please refer to the patient's chart for her consent to telehealth for [facility].  Evaluation Performed:  Preoperative cardiovascular risk assessment _____________   Date:  01/28/2024   Patient ID:  Tracey Branch, DOB 1961-10-05, MRN [id number] Patient Location:  Home Provider location:   Office  Primary Care Provider:  Harvest Forest, MD Primary Cardiologist:  None  Chief Complaint / Patient Profile   63 y.o. y/o female with a h/o chest pain, GERD, fibromyalgia, RA, hyperlipidemia who is pending right total knee arthroplasty by Dr. Blanchie Dessert and presents today for telephonic preoperative cardiovascular risk assessment.  History of Present Illness    Jakiyah Stepney is a 63 y.o. female who presents via audio/video conferencing for a telehealth visit today.  Pt was last seen in cardiology clinic on 09/14/2023 by Dr. Anne Fu.  At that time Yazmyne Sara complained of central chest pain radiating to neck and associated dyspnea.  She underwent coronary CTA and echo for further evaluation.  Echo showed normal LV/RV function, no RWMA, Grade I DD, normal PA pressure, no significant valvular abnormalities.  Coronary CTA showed calcium score of 0 with no evidence of CAD.  The patient is now pending procedure as outlined above. Since her last visit, she is doing well. Patient denies shortness of breath, dyspnea on exertion, lower extremity edema, orthopnea or PND. No chest pain,  pressure, or tightness. No palpitations.  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 She walks her dog and is active around her home.   Past Medical History    Past Medical History:  Diagnosis Date   Arthritis    Depression    Substance abuse (HCC)    Past Surgical History:  Procedure Laterality Date   KNEE ARTHROSCOPY WITH ANTERIOR CRUCIATE LIGAMENT (ACL) REPAIR Right     Allergies  No Active Allergies  Home Medications    Prior to Admission medications   Medication Sig Start Date End Date Taking? Authorizing Provider  AVSOLA 100 MG injection Inject into the vein.    [provider]  Calcium Carb-Cholecalciferol 600-20 MG-MCG TABS Take 1 tablet by mouth 2 (two) times daily.    [provider]  DULoxetine (CYMBALTA) 60 MG capsule Take 60 mg by mouth daily. 01/12/24   [provider]  FLUoxetine (PROZAC) 20 MG capsule Take 20 mg by mouth daily.    [provider]  FLUoxetine (PROZAC) 40 MG capsule TAKE 1 CAPSULE DAILY 07/18/21   Margaree Mackintosh, MD  gabapentin (NEURONTIN) 300 MG capsule Take 300 mg by mouth.    [provider]  omeprazole (PRILOSEC) 20 MG capsule Take 1 capsule (20 mg total) by mouth daily. 08/04/23   Rancour, Jeannett Senior, MD  rosuvastatin (CRESTOR) 5 MG tablet TAKE 1 TABLET DAILY 01/30/22   Margaree Mackintosh, MD  triamcinolone cream (KENALOG) 0.1 % Apply 1 Application topically 2 (two) times daily.    [provider]  valACYclovir (VALTREX) 500 MG tablet Take 500 mg by  mouth daily.    [provider]    Physical Exam    Vital Signs:  Ysabella Usman does not have vital signs available for review today.  Given telephonic nature of communication, physical exam is limited. AAOx3. NAD. Normal affect.  Speech and respirations are unlabored.  Accessory Clinical Findings    Cardiac Studies & Procedures    ______________________________________________________________________________________________     ECHOCARDIOGRAM  ECHOCARDIOGRAM COMPLETE 10/01/2023  Narrative ECHOCARDIOGRAM REPORT    Patient Name:   Myrene Bougher Date of Exam: 10/01/2023 Medical Rec #:  [id number]      Height:       66.0 in Accession #:    [id number]     Weight:       169.0 lb Date of Birth:  [birth year]      BSA:          1.862 m Patient Age:    63 years       BP:           124/76 mmHg Patient Gender: F              HR:           66 bpm. Exam Location:  [address]  Procedure: 2D Echo, 3D Echo, Cardiac Doppler and Color Doppler  Indications:    R07.9 Chest Pain  History:        Patient has no prior history of Echocardiogram examinations. Signs/Symptoms:Chest Pain; Risk Factors:Family History of Coronary Artery Disease and Dyslipidemia.  Sonographer:    Farrel Conners RDCS Referring Phys: Jake Bathe  IMPRESSIONS   1. Left ventricular ejection fraction, by estimation, is 60 to 65%. The left ventricle has normal function. The left ventricle has no regional wall motion abnormalities. Left ventricular diastolic parameters are consistent with Grade I diastolic dysfunction (impaired relaxation). 2. Right ventricular systolic function is normal. The right ventricular size is normal. There is normal pulmonary artery systolic pressure. The estimated right ventricular systolic pressure is 24.2 mmHg. 3. The mitral valve is normal in structure. Trivial mitral valve regurgitation. No evidence of mitral stenosis. 4. The aortic valve is tricuspid. Aortic valve regurgitation is not visualized. No aortic stenosis is present. 5. The inferior vena cava is normal in size with greater than 50% respiratory variability, suggesting right atrial pressure of 3 mmHg.  FINDINGS Left Ventricle: Left ventricular ejection fraction, by estimation, is 60 to 65%. The left ventricle has normal function. The left ventricle has no  regional wall motion abnormalities. The left ventricular internal cavity size was normal in size. There is no left ventricular hypertrophy. Left ventricular diastolic parameters are consistent with Grade I diastolic dysfunction (impaired relaxation).  Right Ventricle: The right ventricular size is normal. No increase in right ventricular wall thickness. Right ventricular systolic function is normal. There is normal pulmonary artery systolic pressure. The tricuspid regurgitant velocity is 2.30 m/s, and with an assumed right atrial pressure of 3 mmHg, the estimated right ventricular systolic pressure is 24.2 mmHg.  Left Atrium: Left atrial size was normal in size.  Right Atrium: Right atrial size was normal in size.  Pericardium: There is no evidence of pericardial effusion.  Mitral Valve: The mitral valve is normal in structure. Trivial mitral valve regurgitation. No evidence of mitral valve stenosis.  Tricuspid Valve: The tricuspid valve is normal in structure. Tricuspid valve regurgitation is trivial.  Aortic Valve: The aortic valve is tricuspid. Aortic valve regurgitation is not visualized. No aortic stenosis is present.  Pulmonic Valve: The  pulmonic valve was normal in structure. Pulmonic valve regurgitation is trivial.  Aorta: The aortic root is normal in size and structure.  Venous: The inferior vena cava is normal in size with greater than 50% respiratory variability, suggesting right atrial pressure of 3 mmHg.  IAS/Shunts: No atrial level shunt detected by color flow Doppler.   LEFT VENTRICLE PLAX 2D LVIDd:         3.60 cm   Diastology LVIDs:         1.90 cm   LV e' medial:    7.94 cm/s LV PW:         0.80 cm   LV E/e' medial:  8.3 LV IVS:        0.90 cm   LV e' lateral:   9.82 cm/s LVOT diam:     2.10 cm   LV E/e' lateral: 6.7 LV SV:         98 LV SV Index:   53 LVOT Area:     3.46 cm  3D Volume EF: 3D EF:        71 % LV EDV:       148 ml LV ESV:       43 ml LV SV:         105 ml  RIGHT VENTRICLE RV Basal diam:  3.60 cm RV S prime:     13.80 cm/s TAPSE (M-mode): 2.5 cm RVSP:           24.2 mmHg  LEFT ATRIUM             Index        RIGHT ATRIUM           Index LA diam:        3.20 cm 1.72 cm/m   RA Pressure: 3.00 mmHg LA Vol (A2C):   53.4 ml 28.68 ml/m  RA Area:     13.50 cm LA Vol (A4C):   57.1 ml 30.67 ml/m  RA Volume:   34.40 ml  18.48 ml/m LA Biplane Vol: 55.2 ml 29.65 ml/m AORTIC VALVE LVOT Vmax:   122.50 cm/s LVOT Vmean:  82.950 cm/s LVOT VTI:    0.283 m  AORTA Ao Root diam: 3.00 cm Ao Asc diam:  3.20 cm  MITRAL VALVE               TRICUSPID VALVE MV Area (PHT): cm         TR Peak grad:   21.2 mmHg MV Decel Time: 222 msec    TR Vmax:        230.00 cm/s MV E velocity: 65.80 cm/s  Estimated RAP:  3.00 mmHg MV A velocity: 85.15 cm/s  RVSP:           24.2 mmHg MV E/A ratio:  0.77 SHUNTS Systemic VTI:  0.28 m Systemic Diam: 2.10 cm  Dalton McleanMD Electronically signed by Wilfred Lacy Signature Date/Time: 10/01/2023/5:17:16 PM    Final      CT SCANS  CT CORONARY MORPH W/CTA COR W/SCORE 12/03/2023  Addendum 12/19/2023 12:31 AM ADDENDUM REPORT: 12/19/2023 00:29  EXAM: OVER-READ INTERPRETATION  CT CHEST  The following report is an over-read performed by radiologist Dr. Alcide Clever of [facility] on 12/19/2023. This over-read does not include interpretation of cardiac or coronary anatomy or pathology. The coronary calcium score/coronary CTA interpretation by the cardiologist is attached.  COMPARISON:  08/04/2023  FINDINGS: Cardiovascular: There are no significant extracardiac vascular findings.  Mediastinum/Nodes: There are no  enlarged lymph nodes within the visualized mediastinum.  Lungs/Pleura: There is no pleural effusion. The visualized lungs appear clear.  Upper abdomen: No significant findings in the visualized upper abdomen.  Musculoskeletal/Chest wall: No chest wall mass or  suspicious osseous findings within the visualized chest.  IMPRESSION: No significant extracardiac findings within the visualized chest.   Electronically Signed By: Alcide Clever M.D. On: 12/19/2023 00:29  Narrative HISTORY: Chest pain/anginal equiv, intermediate CAD risk, not treadmill candidate  EXAM: Cardiac/Coronary CT  TECHNIQUE: The patient was scanned on a [employer].  PROTOCOL: A 100 kV prospective scan was triggered in the descending thoracic aorta at 111 HU's. Axial non-contrast 3 mm slices were carried out through the heart. The data set was analyzed on a dedicated work station and scored using the Agatston method. Gantry rotation speed was 250 msecs and collimation was 0.6 mm. Heart rate was optimized medically and sl NTG was given. The 3D data set was reconstructed in 5% intervals of the 35-75 % of the R-R cycle. Systolic and diastolic phases were analyzed on a dedicated work station using MPR, MIP and VRT modes. The patient received 95mL OMNIPAQUE IOHEXOL 350 MG/ML SOLN of contrast.  FINDINGS: Coronary calcium score: The patient's coronary artery calcium score is 0, which places the patient in the 0 percentile.  Coronary arteries: Normal coronary origins.  Right dominance.  Right Coronary Artery: Normal caliber vessel, gives rise to PDA. No significant plaque or stenosis.  Left Main Coronary Artery: Normal caliber vessel. No significant plaque or stenosis.  Left Anterior Descending Coronary Artery: Normal caliber vessel. No significant plaque or stenosis. Gives rise to one normal diagonal branch.  Left Circumflex Artery: Normal caliber vessel. No significant plaque or stenosis. Gives rise to large, branching first OM branch.  Aorta: Normal size, 29 mm at the mid ascending aorta (level of the PA bifurcation) measured double oblique. No aortic atherosclerosis. No dissection seen in visualized portions of the aorta.  Aortic Valve: No  calcifications. Trileaflet.  Other findings:  Normal pulmonary vein drainage into the left atrium.  Normal left atrial appendage without a thrombus.  Normal size of the pulmonary artery.  Normal appearance of the pericardium.  IMPRESSION: 1. No evidence of CAD, CADRADS = 0.  2. Coronary calcium score of 0. This was 0 percentile for age-, sex-, and race- matched controls.  3. No plaque detected by heartflow program.  4. Normal coronary origin with right dominance.  INTERPRETATION:  CAD-RADS 0: No evidence of CAD (0%). Consider non-atherosclerotic causes of chest pain  Electronically Signed: By: Jodelle Red M.D. On: 12/03/2023 15:46     ______________________________________________________________________________________________       Assessment & Plan    Primary Cardiologist: None  Preoperative cardiovascular risk assessment.  Right total knee arthroplasty by Dr. Blanchie Dessert.  Chart reviewed as part of pre-operative protocol coverage. According to the RCRI, patient has a 0.4% risk of MACE. Patient reports activity equivalent to >4.0 METS (walks her dog and is active in her home).   Given past medical history and time since last visit, based on ACC/AHA guidelines, Terrianne Colaizzi would be at acceptable risk for the planned procedure without further cardiovascular testing.   Patient was advised that if she develops new symptoms prior to surgery to contact our office to arrange a follow-up appointment.  she verbalized understanding.   I will route this recommendation to the requesting party via Epic fax function.  Please call with questions.  Time:   Today, I have  spent 5 minutes with the patient with telehealth technology discussing medical history, symptoms, and management plan.     Carlos Levering, NP  01/28/2024, 8:29 AM

## 2024-07-15 ENCOUNTER — Other Ambulatory Visit: Payer: Self-pay | Admitting: Internal Medicine

## 2024-07-15 DIAGNOSIS — Z1231 Encounter for screening mammogram for malignant neoplasm of breast: Secondary | ICD-10-CM

## 2024-08-04 ENCOUNTER — Ambulatory Visit: Attending: Internal Medicine | Admitting: Physical Therapy

## 2024-08-04 ENCOUNTER — Encounter: Payer: Self-pay | Admitting: Physical Therapy

## 2024-08-04 ENCOUNTER — Other Ambulatory Visit: Payer: Self-pay

## 2024-08-04 DIAGNOSIS — M6281 Muscle weakness (generalized): Secondary | ICD-10-CM | POA: Diagnosis present

## 2024-08-04 DIAGNOSIS — R2681 Unsteadiness on feet: Secondary | ICD-10-CM | POA: Insufficient documentation

## 2024-08-04 DIAGNOSIS — R2689 Other abnormalities of gait and mobility: Secondary | ICD-10-CM | POA: Diagnosis present

## 2024-08-04 NOTE — Therapy (Signed)
 OUTPATIENT PHYSICAL THERAPY NEURO EVALUATION   Patient Name: Tracey Branch MRN: [id number] DOB:Apr 13, 1961, 63 y.o., female Today's Date: 08/04/2024   PCP: Roanna Ezekiel NOVAK, MD  REFERRING PROVIDER: Roanna Ezekiel NOVAK, MD   END OF SESSION:  PT End of Session - 08/04/24 1721     Visit Number 1    Number of Visits 13    Date for PT Re-Evaluation 09/19/24    Authorization Type [employer]    PT Start Time 1632   Pt arrives late   PT Stop Time 1712    PT Time Calculation (min) 40 min    Activity Tolerance Patient tolerated treatment well    Behavior During Therapy [facility] for tasks assessed/performed          Past Medical History:  Diagnosis Date   Arthritis    Depression    Substance abuse (HCC)    Past Surgical History:  Procedure Laterality Date   KNEE ARTHROSCOPY WITH ANTERIOR CRUCIATE LIGAMENT (ACL) REPAIR Right    Patient Active Problem List   Diagnosis Date Noted   GE reflux 06/07/2018   Hypothyroidism, unspecified 03/02/2018   Hypercholesterolemia 11/09/2017   Rheumatoid arthritis (HCC) 09/05/2017   Fibromyalgia 09/05/2017    ONSET DATE: 07/22/2024 (MD referral)  REFERRING DIAG: R26.81 (ICD-10-CM) - Unsteadiness on feet   THERAPY DIAG:  Unsteadiness on feet  Other abnormalities of gait and mobility  Muscle weakness (generalized)  Rationale for Evaluation and Treatment: Rehabilitation  SUBJECTIVE:                                                                                                                                                                                             SUBJECTIVE STATEMENT: Went to Dr. Roanna and he was making sure that my balance is okay.  When going up steps or walking, it just doesn't go as it should.  Catch myself with the counter or wall, at home; tripped over a doorstop and wasn't able to catch myself and broke my wrist.  Having trouble picking up L foot and always tripping on it. Pt accompanied by: significant other in  lobby  PERTINENT HISTORY: RA, R TKR, L4-L5 degenerative disk disease, hx of fibromyalgia, osteoporosis, depression  PAIN:  Are you having pain? Yes: NPRS scale: 4/10 Pain location: low back, radiates into L hip and leg Pain description: burning Aggravating factors: unsure Relieving factors: unsure  PRECAUTIONS: Fall  RED FLAGS: None   WEIGHT BEARING RESTRICTIONS: No  FALLS: Has patient fallen in last 6 months? Yes. Number of falls 3  LIVING ENVIRONMENT: Lives with: lives with their family Lives in: House/apartment Stairs: several steps  to enter and steps to second floor Has following equipment at home: did not ask; likely has cane/RW due to recent TKR  PLOF: Independent  PATIENT GOALS: To get stronger and exercise the right way  OBJECTIVE:  Note: Objective measures were completed at Evaluation unless otherwise noted.  DIAGNOSTIC FINDINGS: NA for this episode  COGNITION: Overall cognitive status: Within functional limits for tasks assessed and memory impairment noted on MD note   SENSATION: Light touch: Impaired  LLE not as intense COORDINATION: WFL  POSTURE: No Significant postural limitations  LOWER EXTREMITY ROM:   WFL BLEs   LOWER EXTREMITY MMT:    MMT Right Eval Left Eval  Hip flexion 3+ 3+  Hip extension    Hip abduction    Hip adduction    Hip internal rotation    Hip external rotation    Knee flexion 4 4  Knee extension 4 4  Ankle dorsiflexion 4 3+  Ankle plantarflexion    Ankle inversion    Ankle eversion    (Blank rows = not tested)   TRANSFERS: Sit to stand: Modified independence  Assistive device utilized: able to do without UE support, with knee pain     Stand to sit: Modified independence  Assistive device utilized: None      STAIRS: Findings: Level of Assistance: SBA, Stair Negotiation Technique: Alternating Pattern  with No Rails, Number of Stairs: 2-3, Height of Stairs: 4-6   , and Comments: Pt has increased lateral sway, LOB,  reaches for rail with descending steps GAIT: Findings: Gait Characteristics: lateral sway at times, step through pattern, scissoring, lateral hip instability, and narrow BOS, Distance walked: 50 ft, Assistive device utilized:None, Level of assistance: Modified independence, and Comments: pt reports L foot occasionally causes her to trip up; not noted today  FUNCTIONAL TESTS:  FGA:  19/30 (Scores <22/30 indicate increased fall risk) 36M walk:  10.97 sec = 2.99 ft/sec   [facility] PT Assessment - 08/04/24 0001       Functional Gait  Assessment   Gait assessed  Yes    Gait Level Surface Walks 20 ft in less than 7 sec but greater than 5.5 sec, uses assistive device, slower speed, mild gait deviations, or deviates 6-10 in outside of the 12 in walkway width.   6.9   Change in Gait Speed Able to change speed, demonstrates mild gait deviations, deviates 6-10 in outside of the 12 in walkway width, or no gait deviations, unable to achieve a major change in velocity, or uses a change in velocity, or uses an assistive device.    Gait with Horizontal Head Turns Performs head turns with moderate changes in gait velocity, slows down, deviates 10-15 in outside 12 in walkway width but recovers, can continue to walk.   8.52   Gait with Vertical Head Turns Performs task with moderate change in gait velocity, slows down, deviates 10-15 in outside 12 in walkway width but recovers, can continue to walk.    Gait and Pivot Turn Pivot turns safely within 3 sec and stops quickly with no loss of balance.    Step Over Obstacle Is able to step over one shoe box (4.5 in total height) without changing gait speed. No evidence of imbalance.    Gait with Narrow Base of Support Ambulates 7-9 steps.    Gait with Eyes Closed Walks 20 ft, uses assistive device, slower speed, mild gait deviations, deviates 6-10 in outside 12 in walkway width. Ambulates 20 ft in less than 9  sec but greater than 7 sec.    Ambulating Backwards Walks 20 ft,  uses assistive device, slower speed, mild gait deviations, deviates 6-10 in outside 12 in walkway width.    Steps Alternating feet, must use rail.    Total Score 19          . M-CTSIB  Condition 1: Firm Surface, EO 30 Sec, Normal Sway  Condition 2: Firm Surface, EC 30 Sec, Moderate Sway  Condition 3: Foam Surface, EO 30 Sec, Mild Sway  Condition 4: Foam Surface, EC 30 Sec, Mild and Moderate Sway                                                                                                                                 TREATMENT DATE: 08/04/2024    PATIENT EDUCATION: Education details: Eval results, POC  Person educated: Patient Education method: Explanation Education comprehension: verbalized understanding  HOME EXERCISE PROGRAM: Not yet initiated  GOALS: Goals reviewed with patient? Yes  SHORT TERM GOALS: Target date: 08/24/2024  Pt will be independent with HEP for improved balance, gait, strength. Baseline: Goal status: INITIAL  2.  Pt will verbalize understanding of fall prevention in home environment.  Baseline:    Goal status:  INITIAL   LONG TERM GOALS: Target date: 09/19/2024  Pt will be independent with progression of HEP for improved balance, strength, gait. Baseline:  Goal status: INITIAL  2.  Pt will improve FGA score to at least 23/30 to decrease fall risk. Baseline: 19/30 Goal status: INITIAL  3.  MCTSIB Condition 2 and 4 to improve to mild sway for 30 seconds, for improved balance. Baseline: mod sway Goal status: INITIAL  4.  Patient to rate low back pain decreased by 50% during ADLs.   Baseline: 4/10 at eval Goal status: INITIAL  5.  Pt will verbalize plans for continued community fitness upon d/c from PT. Baseline:  Goal status: INITIAL  ASSESSMENT:  CLINICAL IMPRESSION: Patient is a 63 y.o. female who was seen today for physical therapy evaluation and treatment for unsteadiness on feet. She has hx of R TKR in March 2025 as well as  osteoporosis, fibromyalgia, degenerative disk lumbar spine, and reported hx of falls.  She presents today with decreased strength, decreased balance, decreased timing and coordination of gait.  She is at increased fall risk per FGA score and she demo decreased vestibular system use for balance on Conditions 2 and 4 of MCTSIB test.  She would benefit from skilled PT to address the above stated deficits to decrease fall risk and to improve overall functional mobility.  OBJECTIVE IMPAIRMENTS: Abnormal gait, decreased balance, decreased mobility, decreased strength, and pain.   ACTIVITY LIMITATIONS: standing, squatting, transfers, and locomotion level  PARTICIPATION LIMITATIONS: meal prep, cleaning, laundry, community activity, occupation, and fitness activities  PERSONAL FACTORS: 3+ comorbidities: see above are also affecting patient's functional outcome.   REHAB POTENTIAL: Good  CLINICAL DECISION MAKING: Stable/uncomplicated  EVALUATION COMPLEXITY: Low  PLAN:  PT FREQUENCY: 2x/week  PT DURATION: 6 weeks  PLANNED INTERVENTIONS: 97750- Physical Performance Testing, 97110-Therapeutic exercises, 97530- Therapeutic activity, 97112- Neuromuscular re-education, 97535- Self Care, [id number]- Manual therapy, [phone]- Gait training, Patient/Family education, and Balance training  PLAN FOR NEXT SESSION: Initiate HEP for strength, balance; more fully assess hip strength, low back pain and address; work on Tourist information centre manager, limits of stability   Adreyan Carbajal W., PT 08/04/2024, 5:22 PM  [facility] [address] [place], [place] [address] Phone # [phone] Fax # [phone]

## 2024-08-12 ENCOUNTER — Ambulatory Visit: Admitting: Physical Therapy

## 2024-08-12 NOTE — Therapy (Incomplete)
 OUTPATIENT PHYSICAL THERAPY NEURO TREATMENT NOTE   Patient Name: Tracey Branch MRN: [id number] DOB:02/20/1961, 63 y.o., female Today's Date: 08/12/2024   PCP: Roanna Ezekiel NOVAK, MD  REFERRING PROVIDER: Roanna Ezekiel NOVAK, MD   END OF SESSION:    Past Medical History:  Diagnosis Date   Arthritis    Depression    Substance abuse [facility])    Past Surgical History:  Procedure Laterality Date   KNEE ARTHROSCOPY WITH ANTERIOR CRUCIATE LIGAMENT (ACL) REPAIR Right    Patient Active Problem List   Diagnosis Date Noted   GE reflux 06/07/2018   Hypothyroidism, unspecified 03/02/2018   Hypercholesterolemia 11/09/2017   Rheumatoid arthritis (HCC) 09/05/2017   Fibromyalgia 09/05/2017    ONSET DATE: 07/22/2024 (MD referral)  REFERRING DIAG: R26.81 (ICD-10-CM) - Unsteadiness on feet   THERAPY DIAG:  No diagnosis found.  Rationale for Evaluation and Treatment: Rehabilitation  SUBJECTIVE:                                                                                                                                                                                             SUBJECTIVE STATEMENT: ***Went to Dr. Roanna and he was making sure that my balance is okay.  When going up steps or walking, it just doesn't go as it should.  Catch myself with the counter or wall, at home; tripped over a doorstop and wasn't able to catch myself and broke my wrist.  Having trouble picking up L foot and always tripping on it. Pt accompanied by: significant other in lobby  PERTINENT HISTORY: RA, R TKR, L4-L5 degenerative disk disease, hx of fibromyalgia, osteoporosis, depression  PAIN:  Are you having pain? Yes: NPRS scale: 4/10 Pain location: low back, radiates into L hip and leg Pain description: burning Aggravating factors: unsure Relieving factors: unsure  PRECAUTIONS: Fall  RED FLAGS: None   WEIGHT BEARING RESTRICTIONS: No  FALLS: Has patient fallen in last 6 months? Yes. Number of  falls 3  LIVING ENVIRONMENT: Lives with: lives with their family Lives in: House/apartment Stairs: several steps to enter and steps to second floor Has following equipment at home: did not ask; likely has cane/RW due to recent TKR  PLOF: Independent  PATIENT GOALS: To get stronger and exercise the right way  OBJECTIVE:    TODAY'S TREATMENT: 08/12/2024 Activity Comments                      ----------------------------------------- Note: Objective measures were completed at Evaluation unless otherwise noted.  DIAGNOSTIC FINDINGS: NA for this episode  COGNITION: Overall cognitive status: Within functional limits for tasks assessed and memory  impairment noted on MD note   SENSATION: Light touch: Impaired  LLE not as intense COORDINATION: WFL  POSTURE: No Significant postural limitations  LOWER EXTREMITY ROM:   WFL BLEs   LOWER EXTREMITY MMT:    MMT Right Eval Left Eval  Hip flexion 3+ 3+  Hip extension    Hip abduction    Hip adduction    Hip internal rotation    Hip external rotation    Knee flexion 4 4  Knee extension 4 4  Ankle dorsiflexion 4 3+  Ankle plantarflexion    Ankle inversion    Ankle eversion    (Blank rows = not tested)   TRANSFERS: Sit to stand: Modified independence  Assistive device utilized: able to do without UE support, with knee pain     Stand to sit: Modified independence  Assistive device utilized: None      STAIRS: Findings: Level of Assistance: SBA, Stair Negotiation Technique: Alternating Pattern  with No Rails, Number of Stairs: 2-3, Height of Stairs: 4-6   , and Comments: Pt has increased lateral sway, LOB, reaches for rail with descending steps GAIT: Findings: Gait Characteristics: lateral sway at times, step through pattern, scissoring, lateral hip instability, and narrow BOS, Distance walked: 50 ft, Assistive device utilized:None, Level of assistance: Modified independence, and Comments: pt reports L foot occasionally  causes her to trip up; not noted today  FUNCTIONAL TESTS:  FGA:  19/30 (Scores <22/30 indicate increased fall risk) 26M walk:  10.97 sec = 2.99 ft/sec     . M-CTSIB  Condition 1: Firm Surface, EO 30 Sec, Normal Sway  Condition 2: Firm Surface, EC 30 Sec, Moderate Sway  Condition 3: Foam Surface, EO 30 Sec, Mild Sway  Condition 4: Foam Surface, EC 30 Sec, Mild and Moderate Sway                                                                                                                                 TREATMENT DATE: 08/04/2024    PATIENT EDUCATION: Education details: Eval results, POC  Person educated: Patient Education method: Explanation Education comprehension: verbalized understanding  HOME EXERCISE PROGRAM: Not yet initiated  GOALS: Goals reviewed with patient? Yes  SHORT TERM GOALS: Target date: 08/24/2024  Pt will be independent with HEP for improved balance, gait, strength. Baseline: Goal status: INITIAL  2.  Pt will verbalize understanding of fall prevention in home environment.  Baseline:    Goal status:  INITIAL   LONG TERM GOALS: Target date: 09/19/2024  Pt will be independent with progression of HEP for improved balance, strength, gait. Baseline:  Goal status: INITIAL  2.  Pt will improve FGA score to at least 23/30 to decrease fall risk. Baseline: 19/30 Goal status: INITIAL  3.  MCTSIB Condition 2 and 4 to improve to mild sway for 30 seconds, for improved balance. Baseline: mod sway Goal status: INITIAL  4.  Patient to rate low back  pain decreased by 50% during ADLs.   Baseline: 4/10 at eval Goal status: INITIAL  5.  Pt will verbalize plans for continued community fitness upon d/c from PT. Baseline:  Goal status: INITIAL  ASSESSMENT:  CLINICAL IMPRESSION: Pt presents today ***. Skilled PT session focused on ***. Pt needs ***. Pt will continue to benefit from skilled PT towards goals for improved functional mobility and decreased fall  risk.   Patient is a 63 y.o. female who was seen today for physical therapy evaluation and treatment for unsteadiness on feet. She has hx of R TKR in March 2025 as well as osteoporosis, fibromyalgia, degenerative disk lumbar spine, and reported hx of falls.  She presents today with decreased strength, decreased balance, decreased timing and coordination of gait.  She is at increased fall risk per FGA score and she demo decreased vestibular system use for balance on Conditions 2 and 4 of MCTSIB test.  She would benefit from skilled PT to address the above stated deficits to decrease fall risk and to improve overall functional mobility.  OBJECTIVE IMPAIRMENTS: Abnormal gait, decreased balance, decreased mobility, decreased strength, and pain.   ACTIVITY LIMITATIONS: standing, squatting, transfers, and locomotion level  PARTICIPATION LIMITATIONS: meal prep, cleaning, laundry, community activity, occupation, and fitness activities  PERSONAL FACTORS: 3+ comorbidities: see above are also affecting patient's functional outcome.   REHAB POTENTIAL: Good  CLINICAL DECISION MAKING: Stable/uncomplicated  EVALUATION COMPLEXITY: Low  PLAN:  PT FREQUENCY: 2x/week  PT DURATION: 6 weeks  PLANNED INTERVENTIONS: 97750- Physical Performance Testing, 97110-Therapeutic exercises, 97530- Therapeutic activity, 97112- Neuromuscular re-education, 97535- Self Care, 02859- Manual therapy, [phone]- Gait training, Patient/Family education, and Balance training  PLAN FOR NEXT SESSION: ***Initiate HEP for strength, balance; more fully assess hip strength, low back pain and address; work on Tourist information centre manager, limits of stability   Jordie Schreur W., PT 08/12/2024, 12:25 PM  [facility] [address], [address] [place], [place] [address] Phone # [phone] Fax # [phone]

## 2024-08-14 ENCOUNTER — Ambulatory Visit: Admitting: Physical Therapy

## 2024-08-19 ENCOUNTER — Ambulatory Visit: Attending: Internal Medicine | Admitting: Physical Therapy

## 2024-08-19 ENCOUNTER — Encounter: Payer: Self-pay | Admitting: Physical Therapy

## 2024-08-19 DIAGNOSIS — M6281 Muscle weakness (generalized): Secondary | ICD-10-CM | POA: Insufficient documentation

## 2024-08-19 DIAGNOSIS — R2681 Unsteadiness on feet: Secondary | ICD-10-CM | POA: Insufficient documentation

## 2024-08-19 DIAGNOSIS — R2689 Other abnormalities of gait and mobility: Secondary | ICD-10-CM | POA: Insufficient documentation

## 2024-08-19 NOTE — Therapy (Signed)
 OUTPATIENT PHYSICAL THERAPY NEURO TREATMENT NOTE   Patient Name: Tracey Branch MRN: [id number] DOB:Aug 24, 1961, 63 y.o., female Today's Date: 08/19/2024   PCP: Roanna Ezekiel NOVAK, MD  REFERRING PROVIDER: Roanna Ezekiel NOVAK, MD   END OF SESSION:  PT End of Session - 08/19/24 1634     Visit Number 2    Number of Visits 13    Date for PT Re-Evaluation 09/19/24    Authorization Type [employer]    PT Start Time 1634   pt arrived late   PT Stop Time 1700    PT Time Calculation (min) 26 min    Activity Tolerance Patient tolerated treatment well    Behavior During Therapy WFL for tasks assessed/performed           Past Medical History:  Diagnosis Date   Arthritis    Depression    Substance abuse (HCC)    Past Surgical History:  Procedure Laterality Date   KNEE ARTHROSCOPY WITH ANTERIOR CRUCIATE LIGAMENT (ACL) REPAIR Right    Patient Active Problem List   Diagnosis Date Noted   GE reflux 06/07/2018   Hypothyroidism, unspecified 03/02/2018   Hypercholesterolemia 11/09/2017   Rheumatoid arthritis (HCC) 09/05/2017   Fibromyalgia 09/05/2017    ONSET DATE: 07/22/2024 (MD referral)  REFERRING DIAG: R26.81 (ICD-10-CM) - Unsteadiness on feet   THERAPY DIAG:  Unsteadiness on feet  Other abnormalities of gait and mobility  Muscle weakness (generalized)  Rationale for Evaluation and Treatment: Rehabilitation  SUBJECTIVE:                                                                                                                                                                                             SUBJECTIVE STATEMENT: I feel like I'm not picking up my left foot as much as my right.  R ankle is bothering me.  Tendons on the top of my feet bother me.  Pt accompanied by: significant other in lobby  PERTINENT HISTORY: RA, R TKR, L4-L5 degenerative disk disease, hx of fibromyalgia, osteoporosis, depression  PAIN:  Are you having pain? Yes: NPRS scale: 3-4/10 Pain  location: low back, radiates into L hip and leg Pain description: burning Aggravating factors: unsure Relieving factors: unsure  PRECAUTIONS: Fall  RED FLAGS: None   WEIGHT BEARING RESTRICTIONS: No  FALLS: Has patient fallen in last 6 months? Yes. Number of falls 3  LIVING ENVIRONMENT: Lives with: lives with their family Lives in: House/apartment Stairs: several steps to enter and steps to second floor Has following equipment at home: did not ask; likely has cane/RW due to recent TKR  PLOF: Independent.  Previously liked yoga, walking;  maybe exercise classes  PATIENT GOALS: To get stronger and exercise the right way  OBJECTIVE:    TODAY'S TREATMENT: 08/12/2024 Activity Comments  MMT:  R hip abduction 4/5 L hip abduction 3+/5 Pt painful to touch at L greater trochanter and painful/tender to palpation along L IT band  -Seated IT band stretch, figure-4 position, LLE x 2 reps -standing IT band stretch at counter-attempted several times Feels good stretch, cues for light/gentle stretch -trunk rotation compensation-hard time getting stretch in L IT band  Sidestep/together R<>L x 10 Hip abductor strength  Alt step taps to 4 step Cues for glut activation at L hip-lateral instability, BUE support  Seated clamshell, 2 x 10 reps Green band      PATIENT EDUCATION: Education details: HEP initiated, hip weakness contributing to gait instability and ways to address; discussed IT band tightness and ways to address Person educated: Patient Education method: Explanation, Demonstration, and Handouts Education comprehension: verbalized understanding, returned demonstration, and needs further education  HOME EXERCISE PROGRAM: Access Code: [email or web address] URL: [email or web address] Date: 08/19/2024 Prepared by: [facility] - Outpatient  Rehab - [facility]  Exercises - Seated Hip Abduction with Resistance  - 1 x daily - 7 x weekly - 2-3 sets - 10 reps - Seated Figure 4  Piriformis Stretch  - 1 x daily - 7 x weekly - 1 sets - 3 reps - 15-30 sec hold - Side Stepping with Counter Support  - 1 x daily - 7 x weekly - 2-3 sets - 10 reps - Alternating Step Taps with Counter Support  - 1 x daily - 7 x weekly - 2-3 sets - 10 reps-UE support for balance  ----------------------------------------- Note: Objective measures were completed at Evaluation unless otherwise noted.  DIAGNOSTIC FINDINGS: NA for this episode  COGNITION: Overall cognitive status: Within functional limits for tasks assessed and memory impairment noted on MD note   SENSATION: Light touch: Impaired  LLE not as intense COORDINATION: WFL  POSTURE: No Significant postural limitations  LOWER EXTREMITY ROM:   WFL BLEs   LOWER EXTREMITY MMT:    MMT Right Eval Left Eval  Hip flexion 3+ 3+  Hip extension    Hip abduction    Hip adduction    Hip internal rotation    Hip external rotation    Knee flexion 4 4  Knee extension 4 4  Ankle dorsiflexion 4 3+  Ankle plantarflexion    Ankle inversion    Ankle eversion    (Blank rows = not tested)   TRANSFERS: Sit to stand: Modified independence  Assistive device utilized: able to do without UE support, with knee pain     Stand to sit: Modified independence  Assistive device utilized: None      STAIRS: Findings: Level of Assistance: SBA, Stair Negotiation Technique: Alternating Pattern  with No Rails, Number of Stairs: 2-3, Height of Stairs: 4-6   , and Comments: Pt has increased lateral sway, LOB, reaches for rail with descending steps GAIT: Findings: Gait Characteristics: lateral sway at times, step through pattern, scissoring, lateral hip instability, and narrow BOS, Distance walked: 50 ft, Assistive device utilized:None, Level of assistance: Modified independence, and Comments: pt reports L foot occasionally causes her to trip up; not noted today  FUNCTIONAL TESTS:  FGA:  19/30 (Scores <22/30 indicate increased fall risk) 54M walk:   10.97 sec = 2.99 ft/sec     . M-CTSIB  Condition 1: Firm Surface, EO 30 Sec, Normal Sway  Condition 2: Firm Surface, EC  30 Sec, Moderate Sway  Condition 3: Foam Surface, EO 30 Sec, Mild Sway  Condition 4: Foam Surface, EC 30 Sec, Mild and Moderate Sway                                                                                                                                 TREATMENT DATE: 08/04/2024    PATIENT EDUCATION: Education details: Eval results, POC  Person educated: Patient Education method: Explanation Education comprehension: verbalized understanding  HOME EXERCISE PROGRAM: Not yet initiated  GOALS: Goals reviewed with patient? Yes  SHORT TERM GOALS: Target date: 08/24/2024  Pt will be independent with HEP for improved balance, gait, strength. Baseline: Goal status: INITIAL  2.  Pt will verbalize understanding of fall prevention in home environment.  Baseline:    Goal status:  INITIAL   LONG TERM GOALS: Target date: 09/19/2024  Pt will be independent with progression of HEP for improved balance, strength, gait. Baseline:  Goal status: INITIAL  2.  Pt will improve FGA score to at least 23/30 to decrease fall risk. Baseline: 19/30 Goal status: INITIAL  3.  MCTSIB Condition 2 and 4 to improve to mild sway for 30 seconds, for improved balance. Baseline: mod sway Goal status: INITIAL  4.  Patient to rate low back pain decreased by 50% during ADLs.   Baseline: 4/10 at eval Goal status: INITIAL  5.  Pt will verbalize plans for continued community fitness upon d/c from PT. Baseline:  Goal status: INITIAL  ASSESSMENT:  CLINICAL IMPRESSION: Pt presents today with no new reports, just really expressing wanting to move more and try to be able to exercise. Skilled PT session focused on assessing hip abductor strength, with pt demo weakness in bilat hip abductors, LLE weaker than RLE.  Also, pt demo tenderness to palpation along L IT band.  Worked on  IT band stretches (provided in sitting to avoid excess trunk rotation and twist at knees in standing) and hip strengthening.  With standing step taps, she has definite decreased stability in L hip and needs UE support for balance.  She will continue to benefit from skilled PT towards goals for improved functional mobility and decreased fall risk.   EVAL:  Patient is a 63 y.o. female who was seen today for physical therapy evaluation and treatment for unsteadiness on feet. She has hx of R TKR in March 2025 as well as osteoporosis, fibromyalgia, degenerative disk lumbar spine, and reported hx of falls.  She presents today with decreased strength, decreased balance, decreased timing and coordination of gait.  She is at increased fall risk per FGA score and she demo decreased vestibular system use for balance on Conditions 2 and 4 of MCTSIB test.  She would benefit from skilled PT to address the above stated deficits to decrease fall risk and to improve overall functional mobility.  OBJECTIVE IMPAIRMENTS: Abnormal gait, decreased balance, decreased mobility, decreased strength, and pain.  ACTIVITY LIMITATIONS: standing, squatting, transfers, and locomotion level  PARTICIPATION LIMITATIONS: meal prep, cleaning, laundry, community activity, occupation, and fitness activities  PERSONAL FACTORS: 3+ comorbidities: see above are also affecting patient's functional outcome.   REHAB POTENTIAL: Good  CLINICAL DECISION MAKING: Stable/uncomplicated  EVALUATION COMPLEXITY: Low  PLAN:  PT FREQUENCY: 2x/week  PT DURATION: 6 weeks  PLANNED INTERVENTIONS: 97750- Physical Performance Testing, 97110-Therapeutic exercises, 97530- Therapeutic activity, 97112- Neuromuscular re-education, 97535- Self Care, 02859- Manual therapy, [phone]- Gait training, Patient/Family education, and Balance training  PLAN FOR NEXT SESSION: Review and progress HEP for hip and core strength, balance; more fully assess low back pain and  address; work on Tourist information centre manager, limits of stability   Bennetta Rudden W., PT 08/19/2024, 5:14 PM  [facility] [address], [address] [place], [place] [address] Phone # [phone] Fax # [phone]

## 2024-08-21 ENCOUNTER — Ambulatory Visit: Admitting: Physical Therapy

## 2024-08-21 ENCOUNTER — Encounter: Payer: Self-pay | Admitting: Physical Therapy

## 2024-08-21 DIAGNOSIS — R2681 Unsteadiness on feet: Secondary | ICD-10-CM

## 2024-08-21 DIAGNOSIS — R2689 Other abnormalities of gait and mobility: Secondary | ICD-10-CM

## 2024-08-21 DIAGNOSIS — M6281 Muscle weakness (generalized): Secondary | ICD-10-CM

## 2024-08-21 NOTE — Therapy (Signed)
 OUTPATIENT PHYSICAL THERAPY NEURO TREATMENT NOTE   Patient Name: Tracey Branch MRN: [id number] DOB:06-19-1961, 63 y.o., female Today's Date: 08/22/2024   PCP: Roanna Ezekiel NOVAK, MD  REFERRING PROVIDER: Roanna Ezekiel NOVAK, MD   END OF SESSION:  PT End of Session - 08/21/24 1615     Visit Number 3    Number of Visits 13    Date for PT Re-Evaluation 09/19/24    Authorization Type [employer]    PT Start Time 1618    PT Stop Time 1656    PT Time Calculation (min) 38 min    Activity Tolerance Patient tolerated treatment well    Behavior During Therapy WFL for tasks assessed/performed            Past Medical History:  Diagnosis Date   Arthritis    Depression    Substance abuse (HCC)    Past Surgical History:  Procedure Laterality Date   KNEE ARTHROSCOPY WITH ANTERIOR CRUCIATE LIGAMENT (ACL) REPAIR Right    Patient Active Problem List   Diagnosis Date Noted   GE reflux 06/07/2018   Hypothyroidism, unspecified 03/02/2018   Hypercholesterolemia 11/09/2017   Rheumatoid arthritis (HCC) 09/05/2017   Fibromyalgia 09/05/2017    ONSET DATE: 07/22/2024 (MD referral)  REFERRING DIAG: R26.81 (ICD-10-CM) - Unsteadiness on feet   THERAPY DIAG:  Muscle weakness (generalized)  Unsteadiness on feet  Other abnormalities of gait and mobility  Rationale for Evaluation and Treatment: Rehabilitation  SUBJECTIVE:                                                                                                                                                                                             SUBJECTIVE STATEMENT: Am aware of the IT band tightness. It is sore. Pt accompanied by: significant other in lobby  PERTINENT HISTORY: RA, R TKR, L4-L5 degenerative disk disease, hx of fibromyalgia, osteoporosis, depression  PAIN:  Are you having pain? Yes: NPRS scale: 4/10 Pain location: low back, radiates into L hip and leg Pain description: burning Aggravating factors:  unsure Relieving factors: unsure  PRECAUTIONS: Fall  RED FLAGS: None   WEIGHT BEARING RESTRICTIONS: No  FALLS: Has patient fallen in last 6 months? Yes. Number of falls 3  LIVING ENVIRONMENT: Lives with: lives with their family Lives in: House/apartment Stairs: several steps to enter and steps to second floor Has following equipment at home: did not ask; likely has cane/RW due to recent TKR  PLOF: Independent.  Previously liked yoga, walking; maybe exercise classes  PATIENT GOALS: To get stronger and exercise the right way  OBJECTIVE:    TODAY'S TREATMENT: 08/19/2024 Activity Comments  Seated abdominal activation: UE lifts-alternating lift BUE lifts Scapular retraction Horizontal adduction 2 x 5 reps   Cues to find lumbar spine neutral and abdominal activation  Addition of yellow band  Standing Paloff press 5-10 reps x 2 Yellow band at doorway, more challenge with LLE closer to band/doorway  Standing gentle minisquats>lateral weightshift>lateral weightshift and reach UE support as needed, more difficult with LLE as stance  Forward/back walking at counter UE support  Forward tandem gait at counter UE support  Gait with visual target Trendelenburg pattern, L hip instability  Modified quadruped at counter-alt UE lifts x 5, alt hip kicks x 5, alt UE/hip lifts x 5 Cues for abdominal activation     PATIENT EDUCATION: Education details: Updates to HEP Person educated: Patient Education method: Programmer, multimedia, Demonstration, and Handouts Education comprehension: verbalized understanding, returned demonstration, and needs further education  HOME EXERCISE PROGRAM: Access Code: [email or web address] URL: [email or web address] Date: 08/19/2024, Updates 08/21/2024* Prepared by: [facility] - Outpatient  Rehab - [facility]  Exercises - Seated Hip Abduction with Resistance  - 1 x daily - 7 x weekly - 2-3 sets - 10 reps - Seated Figure 4 Piriformis Stretch  - 1 x daily - 7 x  weekly - 1 sets - 3 reps - 15-30 sec hold - Side Stepping with Counter Support  - 1 x daily - 7 x weekly - 2-3 sets - 10 reps - Alternating Step Taps with Counter Support  - 1 x daily - 7 x weekly - 2-3 sets - 10 reps-UE support for balance *Lateral weightshift and reach Modified quadruped reach with adominal activation ----------------------------------------- Note: Objective measures were completed at Evaluation unless otherwise noted.  DIAGNOSTIC FINDINGS: NA for this episode  COGNITION: Overall cognitive status: Within functional limits for tasks assessed and memory impairment noted on MD note   SENSATION: Light touch: Impaired  LLE not as intense COORDINATION: WFL  POSTURE: No Significant postural limitations  LOWER EXTREMITY ROM:   WFL BLEs   LOWER EXTREMITY MMT:    MMT Right Eval Left Eval  Hip flexion 3+ 3+  Hip extension    Hip abduction    Hip adduction    Hip internal rotation    Hip external rotation    Knee flexion 4 4  Knee extension 4 4  Ankle dorsiflexion 4 3+  Ankle plantarflexion    Ankle inversion    Ankle eversion    (Blank rows = not tested)   TRANSFERS: Sit to stand: Modified independence  Assistive device utilized: able to do without UE support, with knee pain     Stand to sit: Modified independence  Assistive device utilized: None      STAIRS: Findings: Level of Assistance: SBA, Stair Negotiation Technique: Alternating Pattern  with No Rails, Number of Stairs: 2-3, Height of Stairs: 4-6   , and Comments: Pt has increased lateral sway, LOB, reaches for rail with descending steps GAIT: Findings: Gait Characteristics: lateral sway at times, step through pattern, scissoring, lateral hip instability, and narrow BOS, Distance walked: 50 ft, Assistive device utilized:None, Level of assistance: Modified independence, and Comments: pt reports L foot occasionally causes her to trip up; not noted today  FUNCTIONAL TESTS:  FGA:  19/30 (Scores <22/30  indicate increased fall risk) 73M walk:  10.97 sec = 2.99 ft/sec     . M-CTSIB  Condition 1: Firm Surface, EO 30 Sec, Normal Sway  Condition 2: Firm Surface, EC 30 Sec, Moderate Sway  Condition 3: Foam Surface, EO 30  Sec, Mild Sway  Condition 4: Foam Surface, EC 30 Sec, Mild and Moderate Sway                                                                                                                                 TREATMENT DATE: 08/04/2024    PATIENT EDUCATION: Education details: Eval results, POC  Person educated: Patient Education method: Explanation Education comprehension: verbalized understanding  HOME EXERCISE PROGRAM: Not yet initiated  GOALS: Goals reviewed with patient? Yes  SHORT TERM GOALS: Target date: 08/24/2024  Pt will be independent with HEP for improved balance, gait, strength. Baseline: Goal status: INITIAL  2.  Pt will verbalize understanding of fall prevention in home environment.  Baseline:    Goal status:  INITIAL   LONG TERM GOALS: Target date: 09/19/2024  Pt will be independent with progression of HEP for improved balance, strength, gait. Baseline:  Goal status: INITIAL  2.  Pt will improve FGA score to at least 23/30 to decrease fall risk. Baseline: 19/30 Goal status: INITIAL  3.  MCTSIB Condition 2 and 4 to improve to mild sway for 30 seconds, for improved balance. Baseline: mod sway Goal status: INITIAL  4.  Patient to rate low back pain decreased by 50% during ADLs.   Baseline: 4/10 at eval Goal status: INITIAL  5.  Pt will verbalize plans for continued community fitness upon d/c from PT. Baseline:  Goal status: INITIAL  ASSESSMENT:  CLINICAL IMPRESSION: Pt presents today and reports she can feel the tenderness in the IT band area we worked last session. Skilled PT session focused on abdominal/core activation and balance.  She continues to demonstrate weakness and Trendelenburg pattern with LLE as stance, needing UE  support if LLE is stance leg alone.  Given that pt also has order from ortho MD regarding back pain/PT, in light of L hip instability with balance activities, will plan to more fully assess low back pain/weakness/radiating pain to fully address.  Pt will continue to benefit from skilled PT towards goals for improved functional mobility and decreased fall risk.    EVAL:  Patient is a 63 y.o. female who was seen today for physical therapy evaluation and treatment for unsteadiness on feet. She has hx of R TKR in March 2025 as well as osteoporosis, fibromyalgia, degenerative disk lumbar spine, and reported hx of falls.  She presents today with decreased strength, decreased balance, decreased timing and coordination of gait.  She is at increased fall risk per FGA score and she demo decreased vestibular system use for balance on Conditions 2 and 4 of MCTSIB test.  She would benefit from skilled PT to address the above stated deficits to decrease fall risk and to improve overall functional mobility.  OBJECTIVE IMPAIRMENTS: Abnormal gait, decreased balance, decreased mobility, decreased strength, and pain.   ACTIVITY LIMITATIONS: standing, squatting, transfers, and locomotion level  PARTICIPATION LIMITATIONS: meal prep, cleaning, laundry, community activity, occupation, and fitness  activities  PERSONAL FACTORS: 3+ comorbidities: see above are also affecting patient's functional outcome.   REHAB POTENTIAL: Good  CLINICAL DECISION MAKING: Stable/uncomplicated  EVALUATION COMPLEXITY: Low  PLAN:  PT FREQUENCY: 2x/week  PT DURATION: 6 weeks  PLANNED INTERVENTIONS: 97750- Physical Performance Testing, 97110-Therapeutic exercises, 97530- Therapeutic activity, 97112- Neuromuscular re-education, 97535- Self Care, 02859- Manual therapy, [phone]- Gait training, Patient/Family education, and Balance training  PLAN FOR NEXT SESSION: Review and progress HEP for hip/glut and core strength, balance; *fully assess  low back pain and address; work on multi-sensory balance, limits of stability; LLE as stance with stepping forward/back   Clema Skousen W., PT 08/22/2024, 3:59 PM  [facility] at [facility] [address], [address] [place], [place] [address] Phone # [phone] Fax # [phone]

## 2024-08-26 ENCOUNTER — Ambulatory Visit: Admitting: Physical Therapy

## 2024-09-02 ENCOUNTER — Ambulatory Visit: Admitting: Physical Therapy

## 2024-09-02 NOTE — Therapy (Incomplete)
 OUTPATIENT PHYSICAL THERAPY NEURO TREATMENT NOTE   Patient Name: Tracey Branch MRN: [id number] DOB:Oct 18, 1961, 63 y.o., female Today's Date: 09/02/2024   PCP: Roanna Ezekiel NOVAK, MD  REFERRING PROVIDER: Roanna Ezekiel NOVAK, MD   END OF SESSION:      Past Medical History:  Diagnosis Date   Arthritis    Depression    Substance abuse [facility])    Past Surgical History:  Procedure Laterality Date   KNEE ARTHROSCOPY WITH ANTERIOR CRUCIATE LIGAMENT (ACL) REPAIR Right    Patient Active Problem List   Diagnosis Date Noted   GE reflux 06/07/2018   Hypothyroidism, unspecified 03/02/2018   Hypercholesterolemia 11/09/2017   Rheumatoid arthritis (HCC) 09/05/2017   Fibromyalgia 09/05/2017    ONSET DATE: 07/22/2024 (MD referral)  REFERRING DIAG: R26.81 (ICD-10-CM) - Unsteadiness on feet   THERAPY DIAG:  No diagnosis found.  Rationale for Evaluation and Treatment: Rehabilitation  SUBJECTIVE:                                                                                                                                                                                             SUBJECTIVE STATEMENT: ***Am aware of the IT band tightness. It is sore. Pt accompanied by: significant other in lobby  PERTINENT HISTORY: RA, R TKR, L4-L5 degenerative disk disease, hx of fibromyalgia, osteoporosis, depression  PAIN:  Are you having pain? Yes: NPRS scale: 4/10 Pain location: low back, radiates into L hip and leg Pain description: burning Aggravating factors: unsure Relieving factors: unsure  PRECAUTIONS: Fall  RED FLAGS: None   WEIGHT BEARING RESTRICTIONS: No  FALLS: Has patient fallen in last 6 months? Yes. Number of falls 3  LIVING ENVIRONMENT: Lives with: lives with their family Lives in: House/apartment Stairs: several steps to enter and steps to second floor Has following equipment at home: did not ask; likely has cane/RW due to recent TKR  PLOF: Independent.   Previously liked yoga, walking; maybe exercise classes  PATIENT GOALS: To get stronger and exercise the right way  OBJECTIVE:    TODAY'S TREATMENT: 09/02/2024 Activity Comments                      TODAY'S TREATMENT: 08/19/2024 Activity Comments  Seated abdominal activation: UE lifts-alternating lift BUE lifts Scapular retraction Horizontal adduction 2 x 5 reps   Cues to find lumbar spine neutral and abdominal activation  Addition of yellow band  Standing Paloff press 5-10 reps x 2 Yellow band at doorway, more challenge with LLE closer to band/doorway  Standing gentle minisquats>lateral weightshift>lateral weightshift and reach UE support as needed, more difficult with LLE as stance  Forward/back walking at counter UE support  Forward tandem gait at counter UE support  Gait with visual target Trendelenburg pattern, L hip instability  Modified quadruped at counter-alt UE lifts x 5, alt hip kicks x 5, alt UE/hip lifts x 5 Cues for abdominal activation     PATIENT EDUCATION: Education details: Updates to HEP Person educated: Patient Education method: Explanation, Demonstration, and Handouts Education comprehension: verbalized understanding, returned demonstration, and needs further education  HOME EXERCISE PROGRAM: Access Code: [email or web address] URL: [email or web address] Date: 08/19/2024, Updates 08/21/2024* Prepared by: [facility] - Outpatient  Rehab - [facility]  Exercises - Seated Hip Abduction with Resistance  - 1 x daily - 7 x weekly - 2-3 sets - 10 reps - Seated Figure 4 Piriformis Stretch  - 1 x daily - 7 x weekly - 1 sets - 3 reps - 15-30 sec hold - Side Stepping with Counter Support  - 1 x daily - 7 x weekly - 2-3 sets - 10 reps - Alternating Step Taps with Counter Support  - 1 x daily - 7 x weekly - 2-3 sets - 10 reps-UE support for balance *Lateral weightshift and reach Modified quadruped reach with adominal  activation ----------------------------------------- Note: Objective measures were completed at Evaluation unless otherwise noted.  DIAGNOSTIC FINDINGS: NA for this episode  COGNITION: Overall cognitive status: Within functional limits for tasks assessed and memory impairment noted on MD note   SENSATION: Light touch: Impaired  LLE not as intense COORDINATION: WFL  POSTURE: No Significant postural limitations  LOWER EXTREMITY ROM:   WFL BLEs   LOWER EXTREMITY MMT:    MMT Right Eval Left Eval  Hip flexion 3+ 3+  Hip extension    Hip abduction    Hip adduction    Hip internal rotation    Hip external rotation    Knee flexion 4 4  Knee extension 4 4  Ankle dorsiflexion 4 3+  Ankle plantarflexion    Ankle inversion    Ankle eversion    (Blank rows = not tested)   TRANSFERS: Sit to stand: Modified independence  Assistive device utilized: able to do without UE support, with knee pain     Stand to sit: Modified independence  Assistive device utilized: None      STAIRS: Findings: Level of Assistance: SBA, Stair Negotiation Technique: Alternating Pattern  with No Rails, Number of Stairs: 2-3, Height of Stairs: 4-6   , and Comments: Pt has increased lateral sway, LOB, reaches for rail with descending steps GAIT: Findings: Gait Characteristics: lateral sway at times, step through pattern, scissoring, lateral hip instability, and narrow BOS, Distance walked: 50 ft, Assistive device utilized:None, Level of assistance: Modified independence, and Comments: pt reports L foot occasionally causes her to trip up; not noted today  FUNCTIONAL TESTS:  FGA:  19/30 (Scores <22/30 indicate increased fall risk) 38M walk:  10.97 sec = 2.99 ft/sec     . M-CTSIB  Condition 1: Firm Surface, EO 30 Sec, Normal Sway  Condition 2: Firm Surface, EC 30 Sec, Moderate Sway  Condition 3: Foam Surface, EO 30 Sec, Mild Sway  Condition 4: Foam Surface, EC 30 Sec, Mild and Moderate Sway  TREATMENT DATE: 08/04/2024    PATIENT EDUCATION: Education details: Eval results, POC  Person educated: Patient Education method: Explanation Education comprehension: verbalized understanding  HOME EXERCISE PROGRAM: Not yet initiated  GOALS: Goals reviewed with patient? Yes  SHORT TERM GOALS: Target date: 08/24/2024  Pt will be independent with HEP for improved balance, gait, strength. Baseline: Goal status: INITIAL  2.  Pt will verbalize understanding of fall prevention in home environment.  Baseline:    Goal status:  INITIAL   LONG TERM GOALS: Target date: 09/19/2024  Pt will be independent with progression of HEP for improved balance, strength, gait. Baseline:  Goal status: INITIAL  2.  Pt will improve FGA score to at least 23/30 to decrease fall risk. Baseline: 19/30 Goal status: INITIAL  3.  MCTSIB Condition 2 and 4 to improve to mild sway for 30 seconds, for improved balance. Baseline: mod sway Goal status: INITIAL  4.  Patient to rate low back pain decreased by 50% during ADLs.   Baseline: 4/10 at eval Goal status: INITIAL  5.  Pt will verbalize plans for continued community fitness upon d/c from PT. Baseline:  Goal status: INITIAL  ASSESSMENT:  CLINICAL IMPRESSION: Pt presents today ***. Skilled PT session focused on ***. Pt needs ***. Pt will continue to benefit from skilled PT towards goals for improved functional mobility and decreased fall risk.   Pt presents today and reports she can feel the tenderness in the IT band area we worked last session. Skilled PT session focused on abdominal/core activation and balance.  She continues to demonstrate weakness and Trendelenburg pattern with LLE as stance, needing UE support if LLE is stance leg alone.  Given that pt also has order from ortho MD regarding back pain/PT, in light of L hip  instability with balance activities, will plan to more fully assess low back pain/weakness/radiating pain to fully address.  Pt will continue to benefit from skilled PT towards goals for improved functional mobility and decreased fall risk.    EVAL:  Patient is a 63 y.o. female who was seen today for physical therapy evaluation and treatment for unsteadiness on feet. She has hx of R TKR in March 2025 as well as osteoporosis, fibromyalgia, degenerative disk lumbar spine, and reported hx of falls.  She presents today with decreased strength, decreased balance, decreased timing and coordination of gait.  She is at increased fall risk per FGA score and she demo decreased vestibular system use for balance on Conditions 2 and 4 of MCTSIB test.  She would benefit from skilled PT to address the above stated deficits to decrease fall risk and to improve overall functional mobility.  OBJECTIVE IMPAIRMENTS: Abnormal gait, decreased balance, decreased mobility, decreased strength, and pain.   ACTIVITY LIMITATIONS: standing, squatting, transfers, and locomotion level  PARTICIPATION LIMITATIONS: meal prep, cleaning, laundry, community activity, occupation, and fitness activities  PERSONAL FACTORS: 3+ comorbidities: see above are also affecting patient's functional outcome.   REHAB POTENTIAL: Good  CLINICAL DECISION MAKING: Stable/uncomplicated  EVALUATION COMPLEXITY: Low  PLAN:  PT FREQUENCY: 2x/week  PT DURATION: 6 weeks  PLANNED INTERVENTIONS: 97750- Physical Performance Testing, 97110-Therapeutic exercises, 97530- Therapeutic activity, [id number]- Neuromuscular re-education, 97535- Self Care, 02859- Manual therapy, [phone]- Gait training, Patient/Family education, and Balance training  PLAN FOR NEXT SESSION: ***Review and progress HEP for hip/glut and core strength, balance; *fully assess low back pain and address; work on multi-sensory balance, limits of stability; LLE as stance with stepping  forward/back   Zorana Brockwell W., PT 09/02/2024,  12:22 PM  [facility] at [facility] [address] [place], [address] [place], [place] [address] Phone # [phone] Fax # [phone]

## 2024-09-04 ENCOUNTER — Ambulatory Visit: Admitting: Physical Therapy

## 2024-09-04 ENCOUNTER — Encounter: Payer: Self-pay | Admitting: Physical Therapy

## 2024-09-04 DIAGNOSIS — R2681 Unsteadiness on feet: Secondary | ICD-10-CM

## 2024-09-04 DIAGNOSIS — R2689 Other abnormalities of gait and mobility: Secondary | ICD-10-CM

## 2024-09-04 DIAGNOSIS — M6281 Muscle weakness (generalized): Secondary | ICD-10-CM

## 2024-09-04 NOTE — Therapy (Signed)
 OUTPATIENT PHYSICAL THERAPY NEURO TREATMENT NOTE   Patient Name: Tracey Branch MRN: [id number] DOB:May 29, 1961, 63 y.o., female Today's Date: 09/04/2024   PCP: Roanna Ezekiel NOVAK, MD  REFERRING PROVIDER: Roanna Ezekiel NOVAK, MD   END OF SESSION:  PT End of Session - 09/04/24 1623     Visit Number 4    Number of Visits 13    Date for Recertification  09/19/24    Authorization Type [employer]    PT Start Time 1620    PT Stop Time 1659    PT Time Calculation (min) 39 min    Activity Tolerance Patient tolerated treatment well    Behavior During Therapy WFL for tasks assessed/performed             Past Medical History:  Diagnosis Date   Arthritis    Depression    Substance abuse (HCC)    Past Surgical History:  Procedure Laterality Date   KNEE ARTHROSCOPY WITH ANTERIOR CRUCIATE LIGAMENT (ACL) REPAIR Right    Patient Active Problem List   Diagnosis Date Noted   GE reflux 06/07/2018   Hypothyroidism, unspecified 03/02/2018   Hypercholesterolemia 11/09/2017   Rheumatoid arthritis (HCC) 09/05/2017   Fibromyalgia 09/05/2017    ONSET DATE: 07/22/2024 (MD referral)  REFERRING DIAG: R26.81 (ICD-10-CM) - Unsteadiness on feet   THERAPY DIAG:  Muscle weakness (generalized)  Unsteadiness on feet  Other abnormalities of gait and mobility  Rationale for Evaluation and Treatment: Rehabilitation  SUBJECTIVE:                                                                                                                                                                                             SUBJECTIVE STATEMENT: Had injection L5-S1 earlier this week.  Had a massage and she worked on my IT band and that really seemed to help.  The L low back and leg pain is better, now just in the low back. Pt accompanied by: significant other in lobby  PERTINENT HISTORY: RA, R TKR, L4-L5 degenerative disk disease, hx of fibromyalgia, osteoporosis, depression  PAIN:  Are you having pain?  Yes: NPRS scale: 5/10 Pain location: low back, not really radiating into L hip and leg Pain description: burning Aggravating factors: unsure Relieving factors: unsure  PRECAUTIONS: Fall  RED FLAGS: None   WEIGHT BEARING RESTRICTIONS: No  FALLS: Has patient fallen in last 6 months? Yes. Number of falls 3  LIVING ENVIRONMENT: Lives with: lives with their family Lives in: House/apartment Stairs: several steps to enter and steps to second floor Has following equipment at home: did not ask; likely has cane/RW due to recent TKR  PLOF: Independent.  Previously liked yoga, walking; maybe exercise classes  PATIENT GOALS: To get stronger and exercise the right way  OBJECTIVE:  Pt requests to review HEP fully.  Reviewed the following:   - Seated Hip Abduction with Resistance  - 1 x daily - 7 x weekly - 2-3 sets - 10 reps (discharging today-easy) - Seated Figure 4 Piriformis Stretch  - 1 x daily - 7 x weekly - 1 sets - 3 reps - 15-30 sec hold (good form, good stretch) - Side Stepping with Counter Support  - 1 x daily - 7 x weekly - 2-3 sets - 10 reps (upgrading to add resistance) - Alternating Step Taps with Counter Support  - 1 x daily - 7 x weekly - 2-3 sets - 10 reps-UE support for balance (able to progress to no UE support) *Lateral weightshift and reach (good form, good weightshift and balance on LLE) Modified quadruped reach with adominal activation-bird dog (good form)  TODAY'S TREATMENT: 09/04/2024 Activity Comments  Sidestepping with yellow theraband at ankles Good form fatigue in L hip  Forward step ups to 6 step, x 10 reps Side step ups to 6 step 3-5 reps Pain in back  Forward/back monster walk at counter, no resistance Good stability, good form  Standing paloff press, yellow band, 2 x 5 reps Good form  Standing EC feet apart/feet together 15 seconds Mild sway       HOME EXERCISE PROGRAM: Access Code: [email or web address] URL: [email or web address] Date:  09/04/2024 Prepared by: [facility] - Outpatient  Rehab - [facility]  Exercises - Seated Figure 4 Piriformis Stretch  - 1 x daily - 7 x weekly - 1 sets - 3 reps - 15-30 sec hold - Alternating Step Taps with Counter Support  - 1 x daily - 7 x weekly - 2-3 sets - 10 reps - Lateral Weight Shift with Arm Raise  - 1 x daily - 5 x weekly - 2 sets - 10 reps - Bird Dog  - 1 x daily - 4 x weekly - 2 sets - 10 reps - Side Stepping with Resistance at Ankles and Counter Support  - 1 x daily - 7 x weekly - 1 sets - 3-5 reps - Forward and Backward Monster Walk with Resistance at Ankles and Counter Support  - 1 x daily - 7 x weekly - 1 sets - 3-5 reps  PATIENT EDUCATION: Education details: Updates to HEP; discussed fall prevention Person educated: Patient Education method: Explanation, Demonstration, and Handouts Education comprehension: verbalized understanding, returned demonstration, and needs further education   ----------------------------------------- Note: Objective measures were completed at Evaluation unless otherwise noted.  DIAGNOSTIC FINDINGS: NA for this episode  COGNITION: Overall cognitive status: Within functional limits for tasks assessed and memory impairment noted on MD note   SENSATION: Light touch: Impaired  LLE not as intense COORDINATION: WFL  POSTURE: No Significant postural limitations  LOWER EXTREMITY ROM:   WFL BLEs   LOWER EXTREMITY MMT:    MMT Right Eval Left Eval  Hip flexion 3+ 3+  Hip extension    Hip abduction    Hip adduction    Hip internal rotation    Hip external rotation    Knee flexion 4 4  Knee extension 4 4  Ankle dorsiflexion 4 3+  Ankle plantarflexion    Ankle inversion    Ankle eversion    (Blank rows = not tested)   TRANSFERS: Sit to stand: Modified independence  Assistive device utilized: able to  do without UE support, with knee pain     Stand to sit: Modified independence  Assistive device utilized: None       STAIRS: Findings: Level of Assistance: SBA, Stair Negotiation Technique: Alternating Pattern  with No Rails, Number of Stairs: 2-3, Height of Stairs: 4-6   , and Comments: Pt has increased lateral sway, LOB, reaches for rail with descending steps GAIT: Findings: Gait Characteristics: lateral sway at times, step through pattern, scissoring, lateral hip instability, and narrow BOS, Distance walked: 50 ft, Assistive device utilized:None, Level of assistance: Modified independence, and Comments: pt reports L foot occasionally causes her to trip up; not noted today  FUNCTIONAL TESTS:  FGA:  19/30 (Scores <22/30 indicate increased fall risk) 68M walk:  10.97 sec = 2.99 ft/sec     . M-CTSIB  Condition 1: Firm Surface, EO 30 Sec, Normal Sway  Condition 2: Firm Surface, EC 30 Sec, Moderate Sway  Condition 3: Foam Surface, EO 30 Sec, Mild Sway  Condition 4: Foam Surface, EC 30 Sec, Mild and Moderate Sway                                                                                                                                 TREATMENT DATE: 08/04/2024    PATIENT EDUCATION: Education details: Eval results, POC  Person educated: Patient Education method: Explanation Education comprehension: verbalized understanding  HOME EXERCISE PROGRAM: Not yet initiated  GOALS: Goals reviewed with patient? Yes  SHORT TERM GOALS: Target date: 08/24/2024  Pt will be independent with HEP for improved balance, gait, strength. Baseline: Goal status:MET, 09/04/2024  2.  Pt will verbalize understanding of fall prevention in home environment.  Baseline:    Goal status:  MET, 09/04/2024   LONG TERM GOALS: Target date: 09/19/2024  Pt will be independent with progression of HEP for improved balance, strength, gait. Baseline:  Goal status: INITIAL  2.  Pt will improve FGA score to at least 23/30 to decrease fall risk. Baseline: 19/30 Goal status: INITIAL  3.  MCTSIB Condition 2 and 4 to  improve to mild sway for 30 seconds, for improved balance. Baseline: mod sway Goal status: INITIAL  4.  Patient to rate low back pain decreased by 50% during ADLs.   Baseline: 4/10 at eval Goal status: INITIAL  5.  Pt will verbalize plans for continued community fitness upon d/c from PT. Baseline:  Goal status: INITIAL  ASSESSMENT:  CLINICAL IMPRESSION: Pt presents today after missing several appointments, due to schedule conflict and getting back injection.  She does report less L low back and leg pain today, with pain more centralized (did not proceed with more formal back assessment).  Reviewed HEP and pt is improving with strength and stability through LLE; less Trendelenburg pattern noted and improved balance and limits of stability in session today.  Pt has met 2 of 2 goals; still stamina for exercises is somewhat limited due to back pain,  but pt rates less pain at end of session (4/10) without complaints .  Pt will continue to benefit from skilled PT towards goals for improved functional mobility and decreased fall risk.    EVAL:  Patient is a 63 y.o. female who was seen today for physical therapy evaluation and treatment for unsteadiness on feet. She has hx of R TKR in March 2025 as well as osteoporosis, fibromyalgia, degenerative disk lumbar spine, and reported hx of falls.  She presents today with decreased strength, decreased balance, decreased timing and coordination of gait.  She is at increased fall risk per FGA score and she demo decreased vestibular system use for balance on Conditions 2 and 4 of MCTSIB test.  She would benefit from skilled PT to address the above stated deficits to decrease fall risk and to improve overall functional mobility.  OBJECTIVE IMPAIRMENTS: Abnormal gait, decreased balance, decreased mobility, decreased strength, and pain.   ACTIVITY LIMITATIONS: standing, squatting, transfers, and locomotion level  PARTICIPATION LIMITATIONS: meal prep, cleaning,  laundry, community activity, occupation, and fitness activities  PERSONAL FACTORS: 3+ comorbidities: see above are also affecting patient's functional outcome.   REHAB POTENTIAL: Good  CLINICAL DECISION MAKING: Stable/uncomplicated  EVALUATION COMPLEXITY: Low  PLAN:  PT FREQUENCY: 2x/week  PT DURATION: 6 weeks  PLANNED INTERVENTIONS: 97750- Physical Performance Testing, 97110-Therapeutic exercises, 97530- Therapeutic activity, 97112- Neuromuscular re-education, 97535- Self Care, 02859- Manual therapy, [phone]- Gait training, Patient/Family education, and Balance training  PLAN FOR NEXT SESSION: Review and progress HEP for hip/glut and core strength, balance; work on multi-sensory balance, limits of stability; LLE as stance with stepping forward/back**Unlevel surfaces   Atilano Covelli W., PT 09/04/2024, 8:52 PM  [facility] at [facility] [address] [place], [place] [address] Phone # [phone] Fax # [phone]

## 2024-09-09 ENCOUNTER — Ambulatory Visit: Admitting: Physical Therapy

## 2024-09-11 ENCOUNTER — Ambulatory Visit: Admitting: Physical Therapy

## 2024-09-12 NOTE — Therapy (Incomplete)
 OUTPATIENT PHYSICAL THERAPY NEURO TREATMENT NOTE   Patient Name: Tracey Branch MRN: [id number] DOB:[birth year], 63 y.o., female Today's Date: 09/12/2024   PCP: Roanna Ezekiel NOVAK, MD  REFERRING PROVIDER: Roanna Ezekiel NOVAK, MD   END OF SESSION:       Past Medical History:  Diagnosis Date   Arthritis    Depression    Substance abuse [facility])    Past Surgical History:  Procedure Laterality Date   KNEE ARTHROSCOPY WITH ANTERIOR CRUCIATE LIGAMENT (ACL) REPAIR Right    Patient Active Problem List   Diagnosis Date Noted   GE reflux 06/07/2018   Hypothyroidism, unspecified 03/02/2018   Hypercholesterolemia 11/09/2017   Rheumatoid arthritis (HCC) 09/05/2017   Fibromyalgia 09/05/2017    ONSET DATE: 07/22/2024 (MD referral)  REFERRING DIAG: R26.81 (ICD-10-CM) - Unsteadiness on feet   THERAPY DIAG:  No diagnosis found.  Rationale for Evaluation and Treatment: Rehabilitation  SUBJECTIVE:                                                                                                                                                                                             SUBJECTIVE STATEMENT: Had injection L5-S1 earlier this week.  Had a massage and she worked on my IT band and that really seemed to help.  The L low back and leg pain is better, now just in the low back. Pt accompanied by: significant other in lobby  PERTINENT HISTORY: RA, R TKR, L4-L5 degenerative disk disease, hx of fibromyalgia, osteoporosis, depression  PAIN:  Are you having pain? Yes: NPRS scale: 5/10 Pain location: low back, not really radiating into L hip and leg Pain description: burning Aggravating factors: unsure Relieving factors: unsure  PRECAUTIONS: Fall  RED FLAGS: None   WEIGHT BEARING RESTRICTIONS: No  FALLS: Has patient fallen in last 6 months? Yes. Number of falls 3  LIVING ENVIRONMENT: Lives with: lives with their family Lives in: House/apartment Stairs: several steps to  enter and steps to second floor Has following equipment at home: did not ask; likely has cane/RW due to recent TKR  PLOF: Independent.  Previously liked yoga, walking; maybe exercise classes  PATIENT GOALS: To get stronger and exercise the right way  OBJECTIVE:     TODAY'S TREATMENT: 09/15/24 Activity Comments                             Pt requests to review HEP fully.  Reviewed the following:   - Seated Hip Abduction with Resistance  - 1 x daily - 7 x weekly - 2-3 sets - 10 reps (discharging today-easy) -  Seated Figure 4 Piriformis Stretch  - 1 x daily - 7 x weekly - 1 sets - 3 reps - 15-30 sec hold (good form, good stretch) - Side Stepping with Counter Support  - 1 x daily - 7 x weekly - 2-3 sets - 10 reps (upgrading to add resistance) - Alternating Step Taps with Counter Support  - 1 x daily - 7 x weekly - 2-3 sets - 10 reps-UE support for balance (able to progress to no UE support) *Lateral weightshift and reach (good form, good weightshift and balance on LLE) Modified quadruped reach with adominal activation-bird dog (good form)  TODAY'S TREATMENT: 09/04/2024 Activity Comments  Sidestepping with yellow theraband at ankles Good form fatigue in L hip  Forward step ups to 6 step, x 10 reps Side step ups to 6 step 3-5 reps Pain in back  Forward/back monster walk at counter, no resistance Good stability, good form  Standing paloff press, yellow band, 2 x 5 reps Good form  Standing EC feet apart/feet together 15 seconds Mild sway       HOME EXERCISE PROGRAM: Access Code: [email or web address] URL: [email or web address] Date: 09/04/2024 Prepared by: [facility] - Outpatient  Rehab - [facility]  Exercises - Seated Figure 4 Piriformis Stretch  - 1 x daily - 7 x weekly - 1 sets - 3 reps - 15-30 sec hold - Alternating Step Taps with Counter Support  - 1 x daily - 7 x weekly - 2-3 sets - 10 reps - Lateral Weight Shift with Arm Raise  - 1 x daily - 5 x weekly -  2 sets - 10 reps - Bird Dog  - 1 x daily - 4 x weekly - 2 sets - 10 reps - Side Stepping with Resistance at Ankles and Counter Support  - 1 x daily - 7 x weekly - 1 sets - 3-5 reps - Forward and Backward Monster Walk with Resistance at Ankles and Counter Support  - 1 x daily - 7 x weekly - 1 sets - 3-5 reps  PATIENT EDUCATION: Education details: Updates to HEP; discussed fall prevention Person educated: Patient Education method: Explanation, Demonstration, and Handouts Education comprehension: verbalized understanding, returned demonstration, and needs further education   ----------------------------------------- Note: Objective measures were completed at Evaluation unless otherwise noted.  DIAGNOSTIC FINDINGS: NA for this episode  COGNITION: Overall cognitive status: Within functional limits for tasks assessed and memory impairment noted on MD note   SENSATION: Light touch: Impaired  LLE not as intense COORDINATION: WFL  POSTURE: No Significant postural limitations  LOWER EXTREMITY ROM:   WFL BLEs   LOWER EXTREMITY MMT:    MMT Right Eval Left Eval  Hip flexion 3+ 3+  Hip extension    Hip abduction    Hip adduction    Hip internal rotation    Hip external rotation    Knee flexion 4 4  Knee extension 4 4  Ankle dorsiflexion 4 3+  Ankle plantarflexion    Ankle inversion    Ankle eversion    (Blank rows = not tested)   TRANSFERS: Sit to stand: Modified independence  Assistive device utilized: able to do without UE support, with knee pain     Stand to sit: Modified independence  Assistive device utilized: None      STAIRS: Findings: Level of Assistance: SBA, Stair Negotiation Technique: Alternating Pattern  with No Rails, Number of Stairs: 2-3, Height of Stairs: 4-6   , and Comments: Pt has increased lateral sway, LOB,  reaches for rail with descending steps GAIT: Findings: Gait Characteristics: lateral sway at times, step through pattern, scissoring, lateral hip  instability, and narrow BOS, Distance walked: 50 ft, Assistive device utilized:None, Level of assistance: Modified independence, and Comments: pt reports L foot occasionally causes her to trip up; not noted today  FUNCTIONAL TESTS:  FGA:  19/30 (Scores <22/30 indicate increased fall risk) 70M walk:  10.97 sec = 2.99 ft/sec     . M-CTSIB  Condition 1: Firm Surface, EO 30 Sec, Normal Sway  Condition 2: Firm Surface, EC 30 Sec, Moderate Sway  Condition 3: Foam Surface, EO 30 Sec, Mild Sway  Condition 4: Foam Surface, EC 30 Sec, Mild and Moderate Sway                                                                                                                                 TREATMENT DATE: 08/04/2024    PATIENT EDUCATION: Education details: Eval results, POC  Person educated: Patient Education method: Explanation Education comprehension: verbalized understanding  HOME EXERCISE PROGRAM: Not yet initiated  GOALS: Goals reviewed with patient? Yes  SHORT TERM GOALS: Target date: 08/24/2024  Pt will be independent with HEP for improved balance, gait, strength. Baseline: Goal status:MET, 09/04/2024  2.  Pt will verbalize understanding of fall prevention in home environment.  Baseline:    Goal status:  MET, 09/04/2024   LONG TERM GOALS: Target date: 09/19/2024  Pt will be independent with progression of HEP for improved balance, strength, gait. Baseline:  Goal status: INITIAL  2.  Pt will improve FGA score to at least 23/30 to decrease fall risk. Baseline: 19/30 Goal status: INITIAL  3.  MCTSIB Condition 2 and 4 to improve to mild sway for 30 seconds, for improved balance. Baseline: mod sway Goal status: INITIAL  4.  Patient to rate low back pain decreased by 50% during ADLs.   Baseline: 4/10 at eval Goal status: INITIAL  5.  Pt will verbalize plans for continued community fitness upon d/c from PT. Baseline:  Goal status: INITIAL  ASSESSMENT:  CLINICAL  IMPRESSION: Pt presents today after missing several appointments, due to schedule conflict and getting back injection.  She does report less L low back and leg pain today, with pain more centralized (did not proceed with more formal back assessment).  Reviewed HEP and pt is improving with strength and stability through LLE; less Trendelenburg pattern noted and improved balance and limits of stability in session today.  Pt has met 2 of 2 goals; still stamina for exercises is somewhat limited due to back pain, but pt rates less pain at end of session (4/10) without complaints .  Pt will continue to benefit from skilled PT towards goals for improved functional mobility and decreased fall risk.    EVAL:  Patient is a 63 y.o. female who was seen today for physical therapy evaluation and treatment for unsteadiness on feet. She has hx  of R TKR in March 2025 as well as osteoporosis, fibromyalgia, degenerative disk lumbar spine, and reported hx of falls.  She presents today with decreased strength, decreased balance, decreased timing and coordination of gait.  She is at increased fall risk per FGA score and she demo decreased vestibular system use for balance on Conditions 2 and 4 of MCTSIB test.  She would benefit from skilled PT to address the above stated deficits to decrease fall risk and to improve overall functional mobility.  OBJECTIVE IMPAIRMENTS: Abnormal gait, decreased balance, decreased mobility, decreased strength, and pain.   ACTIVITY LIMITATIONS: standing, squatting, transfers, and locomotion level  PARTICIPATION LIMITATIONS: meal prep, cleaning, laundry, community activity, occupation, and fitness activities  PERSONAL FACTORS: 3+ comorbidities: see above are also affecting patient's functional outcome.   REHAB POTENTIAL: Good  CLINICAL DECISION MAKING: Stable/uncomplicated  EVALUATION COMPLEXITY: Low  PLAN:  PT FREQUENCY: 2x/week  PT DURATION: 6 weeks  PLANNED INTERVENTIONS: 97750-  Physical Performance Testing, 97110-Therapeutic exercises, 97530- Therapeutic activity, 97112- Neuromuscular re-education, 97535- Self Care, [id number]- Manual therapy, [phone]- Gait training, Patient/Family education, and Balance training  PLAN FOR NEXT SESSION: Review and progress HEP for hip/glut and core strength, balance; work on multi-sensory balance, limits of stability; LLE as stance with stepping forward/back**Unlevel surfaces   Slater MARLA Christians, PT 09/12/2024, 12:47 PM  [facility] at [facility] [address], [address] [place], [place] [address] Phone # [phone] Fax # [phone]

## 2024-09-15 ENCOUNTER — Telehealth: Payer: Self-pay | Admitting: Physical Therapy

## 2024-09-15 ENCOUNTER — Encounter (HOSPITAL_BASED_OUTPATIENT_CLINIC_OR_DEPARTMENT_OTHER): Payer: Self-pay

## 2024-09-15 ENCOUNTER — Ambulatory Visit: Attending: Internal Medicine | Admitting: Physical Therapy

## 2024-09-15 DIAGNOSIS — M25521 Pain in right elbow: Secondary | ICD-10-CM | POA: Insufficient documentation

## 2024-09-15 DIAGNOSIS — M25522 Pain in left elbow: Secondary | ICD-10-CM | POA: Diagnosis not present

## 2024-09-15 NOTE — ED Triage Notes (Signed)
 Pt c/o acute pain in BUE/ elbows, limited ROM in both, pain w movement. Pain radiates down into fingers, up into neck on R side. Hx RA, states this pain is different.  Onset after dinner

## 2024-09-15 NOTE — Telephone Encounter (Signed)
 Called patient and left VM according to DPR in chart. Notified pt of missed appointment today and reminded of upcoming appointment day/time.  Louana Terrilyn Christians, PT, DPT 09/15/24 4:48 PM  [facility] at [facility] [address], [address] [place], [place] [address] Phone # [phone] Fax # [phone]

## 2024-09-16 ENCOUNTER — Emergency Department (HOSPITAL_BASED_OUTPATIENT_CLINIC_OR_DEPARTMENT_OTHER)
Admission: EM | Admit: 2024-09-16 | Discharge: 2024-09-16 | Disposition: A | Discharge status: Home / Self Care | Attending: Emergency Medicine | Admitting: Emergency Medicine

## 2024-09-16 ENCOUNTER — Emergency Department (HOSPITAL_BASED_OUTPATIENT_CLINIC_OR_DEPARTMENT_OTHER): Admitting: Radiology

## 2024-09-16 DIAGNOSIS — M25521 Pain in right elbow: Secondary | ICD-10-CM

## 2024-09-16 LAB — COMPREHENSIVE METABOLIC PANEL WITH GFR
ALT: 22 U/L (ref 0–44)
AST: 27 U/L (ref 15–41)
Albumin: 4.3 g/dL (ref 3.5–5.0)
Alkaline Phosphatase: 41 U/L (ref 38–126)
Anion gap: 14 (ref 5–15)
BUN: 17 mg/dL (ref 8–23)
CO2: 24 mmol/L (ref 22–32)
Calcium: 8.9 mg/dL (ref 8.9–10.3)
Chloride: 101 mmol/L (ref 98–111)
Creatinine, Ser: 0.82 mg/dL (ref 0.44–1.00)
GFR, Estimated: >60 mL/min (ref >60)
Glucose, Bld: 93 mg/dL (ref 70–99)
Potassium: 3.7 mmol/L (ref 3.5–5.1)
Sodium: 139 mmol/L (ref 135–145)
Total Bilirubin: 0.5 mg/dL (ref 0.0–1.2)
Total Protein: 6.6 g/dL (ref 6.5–8.1)

## 2024-09-16 LAB — CBC WITH DIFFERENTIAL/PLATELET
Abs Immature Granulocytes: 0.02 K/uL (ref 0.00–0.07)
Basophils Absolute: 0 K/uL (ref 0.0–0.1)
Basophils Relative: 0 %
Eosinophils Absolute: 0.1 K/uL (ref 0.0–0.5)
Eosinophils Relative: 2 %
HCT: 38.5 % (ref 36.0–46.0)
Hemoglobin: 13.1 g/dL (ref 12.0–15.0)
Immature Granulocytes: 0 %
Lymphocytes Relative: 30 %
Lymphs Abs: 2.3 K/uL (ref 0.7–4.0)
MCH: 32.3 pg (ref 26.0–34.0)
MCHC: 34 g/dL (ref 30.0–36.0)
MCV: 94.8 fL (ref 80.0–100.0)
Monocytes Absolute: 0.8 K/uL (ref 0.1–1.0)
Monocytes Relative: 10 %
Neutro Abs: 4.5 K/uL (ref 1.7–7.7)
Neutrophils Relative %: 58 %
Platelets: 220 K/uL (ref 150–400)
RBC: 4.06 MIL/uL (ref 3.87–5.11)
RDW: 12.5 % (ref 11.5–15.5)
WBC: 7.7 K/uL (ref 4.0–10.5)
nRBC: 0 % (ref 0.0–0.2)

## 2024-09-16 MED ORDER — PREDNISONE 10 MG (21) PO TBPK: ORAL_TABLET | ORAL | 0 refills | Status: AC

## 2024-09-16 MED ORDER — PREDNISONE 50 MG PO TABS
60.0000 mg | ORAL_TABLET | Freq: Once | ORAL | Status: AC
  Administered 2024-09-16: 60 mg via ORAL
  Filled 2024-09-16: qty 1

## 2024-09-16 NOTE — ED Provider Notes (Signed)
 [place] EMERGENCY DEPARTMENT AT [facility]  Provider Note  CSN: [id number] Arrival date & time: 09/15/24 2331  History Chief Complaint  Patient presents with   Elbow Pain    Bilat    Tracey Branch is a 63 y.o. female with history of RA managed by Rheum with q8wk Simponi infusions reports onset of bilateral R>L elbow pain earlier tonight, worse with movement but no injuries. She reports working in her yard some yesterday but no issues until this evening. Pain feels different from prior RA. Denies fever.    Home Medications Prior to Admission medications   Medication Sig Start Date End Date Taking? Authorizing Provider  predniSONE  (STERAPRED UNI-PAK 21 TAB) 10 MG (21) TBPK tablet 10mg  Tabs, 6 day taper. Use as directed 09/16/24  Yes Roselyn Carlin NOVAK, MD  AVSOLA  100 MG injection Inject into the vein.    [provider]  Calcium  Carb-Cholecalciferol 600-20 MG-MCG TABS Take 1 tablet by mouth 2 (two) times daily.    [provider]  DULoxetine  (CYMBALTA ) 60 MG capsule Take 60 mg by mouth daily. 01/12/24   [provider]  FLUoxetine  (PROZAC ) 20 MG capsule Take 20 mg by mouth daily.    [provider]  FLUoxetine  (PROZAC ) 40 MG capsule TAKE 1 CAPSULE DAILY 07/18/21   Perri Ronal PARAS, MD  gabapentin (NEURONTIN) 300 MG capsule Take 300 mg by mouth. Patient taking differently: Take 600 mg by mouth.    [provider]  omeprazole  (PRILOSEC) 20 MG capsule Take 1 capsule (20 mg total) by mouth daily. 08/04/23   Rancour, Garnette, MD  rosuvastatin  (CRESTOR ) 5 MG tablet TAKE 1 TABLET DAILY Patient not taking: Reported on 08/04/2024 01/30/22   Perri Ronal PARAS, MD  triamcinolone cream (KENALOG) 0.1 % Apply 1 Application topically 2 (two) times daily.    [provider]  valACYclovir (VALTREX) 500 MG tablet Take 500 mg by mouth daily. Patient not taking: Reported on 08/04/2024    [provider]     Allergies    Patient has no  active allergies.   Review of Systems   Review of Systems Please see HPI for pertinent positives and negatives  Physical Exam BP 103/67 (BP Location: Left Arm)   Pulse 76   Temp 98.7 F (37.1 C) (Oral)   Resp 17   SpO2 96%   Physical Exam Vitals and nursing note reviewed.  HENT:     Head: Normocephalic.     Nose: Nose normal.  Eyes:     Extraocular Movements: Extraocular movements intact.  Pulmonary:     Effort: Pulmonary effort is normal.  Musculoskeletal:        General: Tenderness present. No swelling or deformity.     Cervical back: Neck supple.     Comments: Decreased ROM of bilateral elbows, but no external signs of effusion/infection  Skin:    Findings: No rash (on exposed skin).  Neurological:     Mental Status: She is alert and oriented to person, place, and time.  Psychiatric:        Mood and Affect: Mood normal.     ED Results / Procedures / Treatments   EKG None  Procedures Procedures  Medications Ordered in the ED Medications  predniSONE  (DELTASONE ) tablet 60 mg (has no administration in time range)    Initial Impression and Plan  Patient here with bilateral elbow pain, worse on the right, exam is mostly benign aside from some decreased ROM due to pain. Labs done  in triage show normal CBC and CMP. I personally viewed the images from radiology studies and agree with radiologist interpretation: Xrays with effusions, but otherwise no acute findings. Suspect this is RA flare, possibly from yard work, no signs of infection on exam today. Will give a course of steroids, sling for comfort of Right arm. She declines pain medications. Recommend close outpatient Rheum follow up, RTED for any other concerns  ED Course       MDM Rules/Calculators/A&P Medical Decision Making Problems Addressed: Bilateral elbow joint pain: acute illness or injury  Amount and/or Complexity of Data Reviewed Labs: ordered. Decision-making details documented in ED  Course. Radiology: ordered and independent interpretation performed. Decision-making details documented in ED Course.  Risk Prescription drug management.     Final Clinical Impression(s) / ED Diagnoses Final diagnoses:  Bilateral elbow joint pain    Rx / DC Orders ED Discharge Orders          Ordered    predniSONE  (STERAPRED UNI-PAK 21 TAB) 10 MG (21) TBPK tablet        09/16/24 0357             Roselyn Carlin NOVAK, MD 09/16/24 [phone]

## 2024-09-17 NOTE — Therapy (Incomplete)
 OUTPATIENT PHYSICAL THERAPY NEURO TREATMENT NOTE   Patient Name: Tracey Branch MRN: [id number] DOB:13-Nov-1961, 63 y.o., female Today's Date: 09/17/2024   PCP: Roanna Ezekiel NOVAK, MD  REFERRING PROVIDER: Roanna Ezekiel NOVAK, MD   END OF SESSION:       Past Medical History:  Diagnosis Date   Arthritis    Depression    Substance abuse [facility])    Past Surgical History:  Procedure Laterality Date   KNEE ARTHROSCOPY WITH ANTERIOR CRUCIATE LIGAMENT (ACL) REPAIR Right    Patient Active Problem List   Diagnosis Date Noted   GE reflux 06/07/2018   Hypothyroidism, unspecified 03/02/2018   Hypercholesterolemia 11/09/2017   Rheumatoid arthritis (HCC) 09/05/2017   Fibromyalgia 09/05/2017    ONSET DATE: 07/22/2024 (MD referral)  REFERRING DIAG: R26.81 (ICD-10-CM) - Unsteadiness on feet   THERAPY DIAG:  No diagnosis found.  Rationale for Evaluation and Treatment: Rehabilitation  SUBJECTIVE:                                                                                                                                                                                             SUBJECTIVE STATEMENT: Had injection L5-S1 earlier this week.  Had a massage and she worked on my IT band and that really seemed to help.  The L low back and leg pain is better, now just in the low back. Pt accompanied by: significant other in lobby  PERTINENT HISTORY: RA, R TKR, L4-L5 degenerative disk disease, hx of fibromyalgia, osteoporosis, depression  PAIN:  Are you having pain? Yes: NPRS scale: 5/10 Pain location: low back, not really radiating into L hip and leg Pain description: burning Aggravating factors: unsure Relieving factors: unsure  PRECAUTIONS: Fall  RED FLAGS: None   WEIGHT BEARING RESTRICTIONS: No  FALLS: Has patient fallen in last 6 months? Yes. Number of falls 3  LIVING ENVIRONMENT: Lives with: lives with their family Lives in: House/apartment Stairs: several steps to  enter and steps to second floor Has following equipment at home: did not ask; likely has cane/RW due to recent TKR  PLOF: Independent.  Previously liked yoga, walking; maybe exercise classes  PATIENT GOALS: To get stronger and exercise the right way  OBJECTIVE:     TODAY'S TREATMENT: 09/18/24 Activity Comments                             Pt requests to review HEP fully.  Reviewed the following:   - Seated Hip Abduction with Resistance  - 1 x daily - 7 x weekly - 2-3 sets - 10 reps (discharging today-easy) -  Seated Figure 4 Piriformis Stretch  - 1 x daily - 7 x weekly - 1 sets - 3 reps - 15-30 sec hold (good form, good stretch) - Side Stepping with Counter Support  - 1 x daily - 7 x weekly - 2-3 sets - 10 reps (upgrading to add resistance) - Alternating Step Taps with Counter Support  - 1 x daily - 7 x weekly - 2-3 sets - 10 reps-UE support for balance (able to progress to no UE support) *Lateral weightshift and reach (good form, good weightshift and balance on LLE) Modified quadruped reach with adominal activation-bird dog (good form)  TODAY'S TREATMENT: 09/04/2024 Activity Comments  Sidestepping with yellow theraband at ankles Good form fatigue in L hip  Forward step ups to 6 step, x 10 reps Side step ups to 6 step 3-5 reps Pain in back  Forward/back monster walk at counter, no resistance Good stability, good form  Standing paloff press, yellow band, 2 x 5 reps Good form  Standing EC feet apart/feet together 15 seconds Mild sway       HOME EXERCISE PROGRAM: Access Code: [email or web address] URL: [email or web address] Date: 09/04/2024 Prepared by: [facility] - Outpatient  Rehab - [facility]  Exercises - Seated Figure 4 Piriformis Stretch  - 1 x daily - 7 x weekly - 1 sets - 3 reps - 15-30 sec hold - Alternating Step Taps with Counter Support  - 1 x daily - 7 x weekly - 2-3 sets - 10 reps - Lateral Weight Shift with Arm Raise  - 1 x daily - 5 x weekly -  2 sets - 10 reps - Bird Dog  - 1 x daily - 4 x weekly - 2 sets - 10 reps - Side Stepping with Resistance at Ankles and Counter Support  - 1 x daily - 7 x weekly - 1 sets - 3-5 reps - Forward and Backward Monster Walk with Resistance at Ankles and Counter Support  - 1 x daily - 7 x weekly - 1 sets - 3-5 reps  PATIENT EDUCATION: Education details: Updates to HEP; discussed fall prevention Person educated: Patient Education method: Explanation, Demonstration, and Handouts Education comprehension: verbalized understanding, returned demonstration, and needs further education   ----------------------------------------- Note: Objective measures were completed at Evaluation unless otherwise noted.  DIAGNOSTIC FINDINGS: NA for this episode  COGNITION: Overall cognitive status: Within functional limits for tasks assessed and memory impairment noted on MD note   SENSATION: Light touch: Impaired  LLE not as intense COORDINATION: WFL  POSTURE: No Significant postural limitations  LOWER EXTREMITY ROM:   WFL BLEs   LOWER EXTREMITY MMT:    MMT Right Eval Left Eval  Hip flexion 3+ 3+  Hip extension    Hip abduction    Hip adduction    Hip internal rotation    Hip external rotation    Knee flexion 4 4  Knee extension 4 4  Ankle dorsiflexion 4 3+  Ankle plantarflexion    Ankle inversion    Ankle eversion    (Blank rows = not tested)   TRANSFERS: Sit to stand: Modified independence  Assistive device utilized: able to do without UE support, with knee pain     Stand to sit: Modified independence  Assistive device utilized: None      STAIRS: Findings: Level of Assistance: SBA, Stair Negotiation Technique: Alternating Pattern  with No Rails, Number of Stairs: 2-3, Height of Stairs: 4-6   , and Comments: Pt has increased lateral sway, LOB,  reaches for rail with descending steps GAIT: Findings: Gait Characteristics: lateral sway at times, step through pattern, scissoring, lateral hip  instability, and narrow BOS, Distance walked: 50 ft, Assistive device utilized:None, Level of assistance: Modified independence, and Comments: pt reports L foot occasionally causes her to trip up; not noted today  FUNCTIONAL TESTS:  FGA:  19/30 (Scores <22/30 indicate increased fall risk) 33M walk:  10.97 sec = 2.99 ft/sec     . M-CTSIB  Condition 1: Firm Surface, EO 30 Sec, Normal Sway  Condition 2: Firm Surface, EC 30 Sec, Moderate Sway  Condition 3: Foam Surface, EO 30 Sec, Mild Sway  Condition 4: Foam Surface, EC 30 Sec, Mild and Moderate Sway                                                                                                                                 TREATMENT DATE: 08/04/2024    PATIENT EDUCATION: Education details: Eval results, POC  Person educated: Patient Education method: Explanation Education comprehension: verbalized understanding  HOME EXERCISE PROGRAM: Not yet initiated  GOALS: Goals reviewed with patient? Yes  SHORT TERM GOALS: Target date: 08/24/2024  Pt will be independent with HEP for improved balance, gait, strength. Baseline: Goal status:MET, 09/04/2024  2.  Pt will verbalize understanding of fall prevention in home environment.  Baseline:    Goal status:  MET, 09/04/2024   LONG TERM GOALS: Target date: 09/19/2024  Pt will be independent with progression of HEP for improved balance, strength, gait. Baseline:  Goal status: INITIAL  2.  Pt will improve FGA score to at least 23/30 to decrease fall risk. Baseline: 19/30 Goal status: INITIAL  3.  MCTSIB Condition 2 and 4 to improve to mild sway for 30 seconds, for improved balance. Baseline: mod sway Goal status: INITIAL  4.  Patient to rate low back pain decreased by 50% during ADLs.   Baseline: 4/10 at eval Goal status: INITIAL  5.  Pt will verbalize plans for continued community fitness upon d/c from PT. Baseline:  Goal status: INITIAL  ASSESSMENT:  CLINICAL  IMPRESSION: Pt presents today after missing several appointments, due to schedule conflict and getting back injection.  She does report less L low back and leg pain today, with pain more centralized (did not proceed with more formal back assessment).  Reviewed HEP and pt is improving with strength and stability through LLE; less Trendelenburg pattern noted and improved balance and limits of stability in session today.  Pt has met 2 of 2 goals; still stamina for exercises is somewhat limited due to back pain, but pt rates less pain at end of session (4/10) without complaints .  Pt will continue to benefit from skilled PT towards goals for improved functional mobility and decreased fall risk.    EVAL:  Patient is a 63 y.o. female who was seen today for physical therapy evaluation and treatment for unsteadiness on feet. She has hx  of R TKR in March 2025 as well as osteoporosis, fibromyalgia, degenerative disk lumbar spine, and reported hx of falls.  She presents today with decreased strength, decreased balance, decreased timing and coordination of gait.  She is at increased fall risk per FGA score and she demo decreased vestibular system use for balance on Conditions 2 and 4 of MCTSIB test.  She would benefit from skilled PT to address the above stated deficits to decrease fall risk and to improve overall functional mobility.  OBJECTIVE IMPAIRMENTS: Abnormal gait, decreased balance, decreased mobility, decreased strength, and pain.   ACTIVITY LIMITATIONS: standing, squatting, transfers, and locomotion level  PARTICIPATION LIMITATIONS: meal prep, cleaning, laundry, community activity, occupation, and fitness activities  PERSONAL FACTORS: 3+ comorbidities: see above are also affecting patient's functional outcome.   REHAB POTENTIAL: Good  CLINICAL DECISION MAKING: Stable/uncomplicated  EVALUATION COMPLEXITY: Low  PLAN:  PT FREQUENCY: 2x/week  PT DURATION: 6 weeks  PLANNED INTERVENTIONS: 97750-  Physical Performance Testing, 97110-Therapeutic exercises, 97530- Therapeutic activity, 97112- Neuromuscular re-education, 97535- Self Care, [id number]- Manual therapy, [phone]- Gait training, Patient/Family education, and Balance training  PLAN FOR NEXT SESSION: Review and progress HEP for hip/glut and core strength, balance; work on multi-sensory balance, limits of stability; LLE as stance with stepping forward/back**Unlevel surfaces   Slater MARLA Christians, PT 09/17/2024, 11:51 AM  [facility] at [facility] [address], [address] [place], [place] [address] Phone # [phone] Fax # [phone]

## 2024-09-18 ENCOUNTER — Ambulatory Visit: Admitting: Physical Therapy

## 2024-11-14 ENCOUNTER — Ambulatory Visit
Admission: RE | Admit: 2024-11-14 | Discharge: 2024-11-14 | Disposition: A | Discharge status: Home / Self Care | Source: Ambulatory Visit | Attending: Internal Medicine | Admitting: Internal Medicine

## 2024-11-14 ENCOUNTER — Other Ambulatory Visit: Payer: Self-pay | Admitting: Internal Medicine

## 2024-11-14 DIAGNOSIS — J189 Pneumonia, unspecified organism: Secondary | ICD-10-CM
